# Patient Record
Sex: Female | Born: 1945 | Race: White | Hispanic: No | Marital: Married | Smoking: Never smoker
Health system: Southern US, Community
[De-identification: ages and names within clinical notes are randomized; demographics above are authoritative.]

## PROBLEM LIST (undated history)

## (undated) DIAGNOSIS — K5790 Diverticulosis of intestine, part unspecified, without perforation or abscess without bleeding: Secondary | ICD-10-CM

## (undated) DIAGNOSIS — T8859XA Other complications of anesthesia, initial encounter: Secondary | ICD-10-CM

## (undated) DIAGNOSIS — T4145XA Adverse effect of unspecified anesthetic, initial encounter: Secondary | ICD-10-CM

## (undated) DIAGNOSIS — M1990 Unspecified osteoarthritis, unspecified site: Secondary | ICD-10-CM

## (undated) DIAGNOSIS — I350 Nonrheumatic aortic (valve) stenosis: Secondary | ICD-10-CM

## (undated) DIAGNOSIS — H409 Unspecified glaucoma: Secondary | ICD-10-CM

## (undated) DIAGNOSIS — N183 Chronic kidney disease, stage 3 unspecified: Secondary | ICD-10-CM

## (undated) HISTORY — DX: Nonrheumatic aortic (valve) stenosis: I35.0

---

## 1974-08-09 HISTORY — PX: DIAGNOSTIC LAPAROSCOPY: SUR761

## 1981-08-09 HISTORY — PX: ABDOMINAL HYSTERECTOMY: SHX81

## 1984-08-09 HISTORY — PX: TONSILLECTOMY: SUR1361

## 1998-10-27 ENCOUNTER — Encounter: Payer: Self-pay | Admitting: Family Medicine

## 1998-10-27 ENCOUNTER — Ambulatory Visit (HOSPITAL_COMMUNITY): Admission: RE | Admit: 1998-10-27 | Discharge: 1998-10-27 | Payer: Self-pay | Admitting: Family Medicine

## 1998-11-18 ENCOUNTER — Other Ambulatory Visit: Admission: RE | Admit: 1998-11-18 | Discharge: 1998-11-18 | Payer: Self-pay | Admitting: Obstetrics & Gynecology

## 2001-01-30 ENCOUNTER — Other Ambulatory Visit: Admission: RE | Admit: 2001-01-30 | Discharge: 2001-01-30 | Payer: Self-pay | Admitting: Family Medicine

## 2001-08-03 ENCOUNTER — Encounter: Payer: Self-pay | Admitting: Family Medicine

## 2001-08-03 ENCOUNTER — Ambulatory Visit (HOSPITAL_COMMUNITY): Admission: RE | Admit: 2001-08-03 | Discharge: 2001-08-03 | Payer: Self-pay | Admitting: Family Medicine

## 2002-08-16 ENCOUNTER — Encounter: Payer: Self-pay | Admitting: Orthopedic Surgery

## 2002-08-16 ENCOUNTER — Encounter: Admission: RE | Admit: 2002-08-16 | Discharge: 2002-08-16 | Payer: Self-pay | Admitting: Orthopedic Surgery

## 2003-01-02 ENCOUNTER — Encounter: Payer: Self-pay | Admitting: Neurosurgery

## 2003-01-04 ENCOUNTER — Inpatient Hospital Stay (HOSPITAL_COMMUNITY): Admission: RE | Admit: 2003-01-04 | Discharge: 2003-01-06 | Payer: Self-pay | Admitting: Neurosurgery

## 2003-01-04 ENCOUNTER — Encounter: Payer: Self-pay | Admitting: Neurosurgery

## 2003-02-02 ENCOUNTER — Emergency Department (HOSPITAL_COMMUNITY): Admission: EM | Admit: 2003-02-02 | Discharge: 2003-02-02 | Payer: Self-pay

## 2003-02-02 ENCOUNTER — Encounter: Payer: Self-pay | Admitting: Neurological Surgery

## 2004-04-02 ENCOUNTER — Other Ambulatory Visit: Admission: RE | Admit: 2004-04-02 | Discharge: 2004-04-02 | Payer: Self-pay | Admitting: Family Medicine

## 2004-06-26 ENCOUNTER — Encounter (INDEPENDENT_AMBULATORY_CARE_PROVIDER_SITE_OTHER): Payer: Self-pay | Admitting: *Deleted

## 2004-06-26 ENCOUNTER — Ambulatory Visit (HOSPITAL_BASED_OUTPATIENT_CLINIC_OR_DEPARTMENT_OTHER): Admission: RE | Admit: 2004-06-26 | Discharge: 2004-06-26 | Payer: Self-pay | Admitting: General Surgery

## 2004-06-26 ENCOUNTER — Ambulatory Visit (HOSPITAL_COMMUNITY): Admission: RE | Admit: 2004-06-26 | Discharge: 2004-06-26 | Payer: Self-pay | Admitting: General Surgery

## 2004-11-07 HISTORY — PX: BACK SURGERY: SHX140

## 2005-01-13 ENCOUNTER — Encounter: Admission: RE | Admit: 2005-01-13 | Discharge: 2005-01-13 | Payer: Self-pay | Admitting: Neurosurgery

## 2005-03-17 ENCOUNTER — Inpatient Hospital Stay (HOSPITAL_COMMUNITY): Admission: RE | Admit: 2005-03-17 | Discharge: 2005-03-22 | Payer: Self-pay | Admitting: Neurosurgery

## 2005-07-13 ENCOUNTER — Ambulatory Visit: Payer: Self-pay | Admitting: Internal Medicine

## 2005-07-15 ENCOUNTER — Encounter (INDEPENDENT_AMBULATORY_CARE_PROVIDER_SITE_OTHER): Payer: Self-pay | Admitting: *Deleted

## 2005-07-15 ENCOUNTER — Ambulatory Visit: Payer: Self-pay | Admitting: Internal Medicine

## 2005-07-23 ENCOUNTER — Encounter (HOSPITAL_COMMUNITY): Admission: RE | Admit: 2005-07-23 | Discharge: 2005-08-06 | Payer: Self-pay | Admitting: Internal Medicine

## 2005-09-02 ENCOUNTER — Ambulatory Visit: Payer: Self-pay | Admitting: Internal Medicine

## 2006-08-09 HISTORY — PX: KNEE ARTHROPLASTY: SHX992

## 2006-10-16 ENCOUNTER — Encounter: Admission: RE | Admit: 2006-10-16 | Discharge: 2006-10-16 | Payer: Self-pay | Admitting: Orthopedic Surgery

## 2006-12-12 ENCOUNTER — Inpatient Hospital Stay (HOSPITAL_COMMUNITY): Admission: RE | Admit: 2006-12-12 | Discharge: 2006-12-15 | Payer: Self-pay | Admitting: Orthopedic Surgery

## 2008-07-12 ENCOUNTER — Other Ambulatory Visit: Admission: RE | Admit: 2008-07-12 | Discharge: 2008-07-12 | Payer: Self-pay | Admitting: Family Medicine

## 2009-01-29 ENCOUNTER — Encounter (INDEPENDENT_AMBULATORY_CARE_PROVIDER_SITE_OTHER): Payer: Self-pay | Admitting: *Deleted

## 2009-02-03 ENCOUNTER — Encounter: Admission: RE | Admit: 2009-02-03 | Discharge: 2009-02-03 | Payer: Self-pay | Admitting: Neurosurgery

## 2009-05-06 ENCOUNTER — Ambulatory Visit (HOSPITAL_COMMUNITY): Admission: RE | Admit: 2009-05-06 | Discharge: 2009-05-06 | Payer: Self-pay | Admitting: Neurosurgery

## 2009-05-26 ENCOUNTER — Encounter: Admission: RE | Admit: 2009-05-26 | Discharge: 2009-05-26 | Payer: Self-pay | Admitting: Orthopedic Surgery

## 2009-08-25 ENCOUNTER — Telehealth: Payer: Self-pay | Admitting: Internal Medicine

## 2009-09-08 ENCOUNTER — Inpatient Hospital Stay (HOSPITAL_COMMUNITY): Admission: RE | Admit: 2009-09-08 | Discharge: 2009-09-11 | Payer: Self-pay | Admitting: Orthopedic Surgery

## 2010-03-09 HISTORY — PX: JOINT REPLACEMENT: SHX530

## 2010-09-08 NOTE — Progress Notes (Signed)
Summary: Changed Practices --- GI  Phone Note Outgoing Call Call back at Home Phone [phone]   Call placed by: Harlow Mares CMA Duncan Dull),  August 25, 2009 10:37 AM Call placed to: Patient Summary of Call: patients transfered care for GI Initial call taken by: Harlow Mares CMA Duncan Dull),  August 25, 2009 10:38 AM

## 2010-10-25 LAB — COMPREHENSIVE METABOLIC PANEL
ALT: 17 U/L (ref 0–35)
Albumin: 4.2 g/dL (ref 3.5–5.2)
Alkaline Phosphatase: 61 U/L (ref 39–117)
BUN: 24 mg/dL — ABNORMAL HIGH (ref 6–23)
Potassium: 4.4 mEq/L (ref 3.5–5.1)
Sodium: 136 mEq/L (ref 135–145)
Total Protein: 7.1 g/dL (ref 6.0–8.3)

## 2010-10-25 LAB — URINE CULTURE: Colony Count: 15000

## 2010-10-25 LAB — APTT: aPTT: 27 seconds (ref 24–37)

## 2010-10-25 LAB — DIFFERENTIAL
Basophils Relative: 1 % (ref 0–1)
Monocytes Absolute: 0.4 10*3/uL (ref 0.1–1.0)
Monocytes Relative: 8 % (ref 3–12)
Neutro Abs: 2.4 10*3/uL (ref 1.7–7.7)

## 2010-10-25 LAB — CROSSMATCH

## 2010-10-25 LAB — CBC
Platelets: 239 10*3/uL (ref 150–400)
RDW: 12.1 % (ref 11.5–15.5)

## 2010-10-25 LAB — URINALYSIS, ROUTINE W REFLEX MICROSCOPIC
Bilirubin Urine: NEGATIVE
Nitrite: NEGATIVE
Specific Gravity, Urine: 1.007 (ref 1.005–1.030)
pH: 7 (ref 5.0–8.0)

## 2010-10-28 LAB — BASIC METABOLIC PANEL
BUN: 10 mg/dL (ref 6–23)
CO2: 28 mEq/L (ref 19–32)
Calcium: 8.4 mg/dL (ref 8.4–10.5)
Chloride: 104 mEq/L (ref 96–112)
Chloride: 106 mEq/L (ref 96–112)
GFR calc Af Amer: >60 mL/min (ref >60)
GFR calc Af Amer: >60 mL/min (ref >60)
GFR calc non Af Amer: >60 mL/min (ref >60)
GFR calc non Af Amer: >60 mL/min (ref >60)
Glucose, Bld: 113 mg/dL — ABNORMAL HIGH (ref 70–99)
Glucose, Bld: 123 mg/dL — ABNORMAL HIGH (ref 70–99)
Potassium: 3.7 mEq/L (ref 3.5–5.1)
Potassium: 3.7 mEq/L (ref 3.5–5.1)
Potassium: 3.9 mEq/L (ref 3.5–5.1)
Sodium: 137 mEq/L (ref 135–145)
Sodium: 138 mEq/L (ref 135–145)
Sodium: 138 mEq/L (ref 135–145)

## 2010-10-28 LAB — CBC
HCT: 26.6 % — ABNORMAL LOW (ref 36.0–46.0)
HCT: 27.8 % — ABNORMAL LOW (ref 36.0–46.0)
Hemoglobin: 9 g/dL — ABNORMAL LOW (ref 12.0–15.0)
Hemoglobin: 9.3 g/dL — ABNORMAL LOW (ref 12.0–15.0)
Hemoglobin: 9.8 g/dL — ABNORMAL LOW (ref 12.0–15.0)
MCHC: 33.7 g/dL (ref 30.0–36.0)
MCV: 93.6 fL (ref 78.0–100.0)
Platelets: 175 10*3/uL (ref 150–400)
Platelets: 200 10*3/uL (ref 150–400)
RDW: 12.6 % (ref 11.5–15.5)
RDW: 12.9 % (ref 11.5–15.5)
WBC: 4.4 10*3/uL (ref 4.0–10.5)
WBC: 5.8 10*3/uL (ref 4.0–10.5)

## 2010-12-25 NOTE — Op Note (Signed)
[name], [name] NO.:  [id number]   MEDICAL RECORD NO.:  [id number]          PATIENT TYPE:  INP   LOCATION:  2899                         FACILITY:  [facility]   PHYSICIAN:  Payton Doughty, M.D.      DATE OF BIRTH:  1945/11/24   DATE OF PROCEDURE:  03/17/2005  DATE OF DISCHARGE:                                 OPERATIVE REPORT   PREOPERATIVE DIAGNOSIS:  Spondylosis, L2-3, L3-4, L4-5, L5-S1.   POSTOPERATIVE DIAGNOSIS:  Spondylosis, L2-3, L3-4, L4-5, L5-S1, plus  bilateral pars fractures at L3.   PROCEDURE:  L2-3, L3-4, L4-5 and L5-S1 laminectomy, diskectomy and posterior  lumbar interbody fusion with Ray threaded fusion cages, pedicle screw  fixation from L2 to S1 and posterolateral arthrodesis, L2 to S1.   SURGEON:  Payton Doughty, M.D.   NURSE ASSISTANT:  Gleed.   DOCTOR ASSISTANT:  Coletta Memos, M.D.   SERVICE:  Neurosurgery.   ANESTHESIA:  General endotracheal.   PREPARATION:  Prepped with sterile Betadine prep and scrubbed with alcohol  wipe.   COMPLICATIONS:  None.   BODY OF TEXT:  [age]-year-old lady with severe neurogenic claudication  and lumbar spondylosis.   DESCRIPTION OF PROCEDURE:  She was taken to the operating room and smooth  anesthetized and intubated, and placed prone on the operating table.  Following shaving, prepped and draped in the usual sterile fashion.  Skin  was infiltrated with 1% lidocaine and 1:400,000 epinephrine.  The old skin  incision was reopened and extended up to the mid L1 and down to mid S1.  The  lamina and transverse processes of L2, L3, L4, L5 and the sacral ala were  exposed bilaterally in the subperiosteal plane.  Intraoperative x-ray  confirmed correctness of the level.  Having confirmed correctness of the  level, the pars interarticularis, lamina and inferior remaining lamina and  inferior facet of L2, L3, L4 and L5 were removed bilaterally.  The superior  facets of L3, L4, L5 and S1 were removed  bilaterally using the high-speed  drill and the bone set aside for grafting.  At 2-3, there was a disk on the  left side with severe spondylitic compression in the nerve root,  particularly on the left side.  At 3-4, at the site of a prior  decompression, there were bilateral pars fractures and a spondylolisthesis.  This was reduced following removal of the fragment of the pars; 4-5 had also  been decompressed, but the pars were intact.  There was significant  spondylitic change in the facet joints and 5-1 was extremely degenerated  with bilateral neural foraminal narrowing, worse on the right than on the  left.  Following complete decompression, her Ray threaded fusion cages, 12 x  21 mm, were placed at L2-3, a single one on the left at L3-4, a 14-mm at L4-  5 and 12-mm at L5-S1.  They were packed with bone graft harvested from the  facet joints.  Pedicle screws were placed in L2, L3, L4, L5 and S1.  Intraoperative x-rays showed good placement of pedicle screws.  They were  connected by a rod and the locking caps locked.  The transverse processes  were decorticated and OP-1 bone-morphogenic protein was placed over as a  posterolateral arthrodesis.  Intraoperative x-rays showed good placement of  Ray cages, pedicle screws, rods and caps.  The __________ tissue and fascia  were closed with 0 Vicryl in interrupted fashion, the subcutaneous tissue  was reapproximated with 3-0 Vicryl in interrupted fashion and the skin was  closed with 3-0 nylon in running-locked fashion.  Betadine and Telfa  dressing were applied and the patient returned to the recovery room in good  condition.       MWR/MEDQ  D:  03/17/2005  T:  03/18/2005  Job:  [id number]

## 2010-12-25 NOTE — Op Note (Signed)
NAMESAIRAH, [name]                          ACCOUNT NO.:  [id number]   MEDICAL RECORD NO.:  [id number]                   PATIENT TYPE:  INP   LOCATION:  3002                                 FACILITY:  [facility]   PHYSICIAN:  Payton Doughty, M.D.                   DATE OF BIRTH:  06/20/1946   DATE OF PROCEDURE:  01/04/2003  DATE OF DISCHARGE:                                 OPERATIVE REPORT   PREOPERATIVE DIAGNOSIS:  Spondylosis/spinal stenosis, L3-4 and L4-5.   POSTOPERATIVE DIAGNOSIS:  Spondylosis/spinal stenosis, L3-4 and L4-5.   OPERATIVE PROCEDURES:  L3-4, L4-5 laminotomy/foraminotomy done bilaterally.   SURGEON:  Dr. Trey Sailors.   ANESTHESIA:  General endotracheal.   COMPLICATIONS:  None.   NURSE ASSISTANT:  Covington.   DOCTOR ASSISTANT:  Dr. Venetia Maxon.   DESCRIPTION OF PROCEDURE:  This is a 65 year old lady with neurogenic  claudication.  __________, intubated, placed prone on the operating table,  whereupon she was prepped and draped in the usual sterile fashion.  She was  infiltrated with 1% lidocaine and 1:5000 epinephrine.  The skin was incised  from the top of L5 to the bottom of L2.  Laminae of L3 and L4 were exposed  bilaterally in the subperiosteal plane, and the intraoperative x-ray  confirmed correctness level.  Having confirmed correctness level,  laminotomy/foraminotomy was done bilaterally at both L4-5 and L5-S1; this  was done with the drill, Kerrison, and Leksell.  On the left side, there was  tight stenosis with abundant redundant ligamentum flavum at L3-4 and L4-5,  less so at 4-5.  Right side was definitely tighter at 3-4.  Complete removal  of the laminae and the attendant ligamentum flavum resulted in immediate  decompression of nerve roots.  There was undercutting of the ligamentum  flavum underneath 5 and underneath 2, and this markedly diminished the  compressive pathology.  Neural foramina were carefully explored.  The  anterior epidural space was  explored; there was a small disk at 3-4, but it  did not appear to be causing a lot of compression; no tightness in the  nerves.  Wound was irrigated, and the laminectomy defects filled with Depo-  Medrol subfat.  The fascia was reapproximated with 0 Vicryl in interrupted  fashion.  Subcutaneous tissues were reapproximated with 0 Vicryl in  interrupted fashion.  Subcuticular tissues were reapproximated with 3-0  Vicryl in interrupted fashion.  Skin was closed with 3-0 nylon in a running  locked fashion.  Betadine topical dressing was applied and an occlusive  OpSite, and the patient returned to recovery room in good condition.                                               Payton Doughty, M.D.  MWR/MEDQ  D:  01/04/2003  T:  01/04/2003  Job:  [id number]

## 2010-12-25 NOTE — Op Note (Signed)
NAMEDEIONNA, MARCANTONIO                ACCOUNT NO.:  [id number]   MEDICAL RECORD NO.:  [id number]          PATIENT TYPE:  AMB   LOCATION:  DSC                          FACILITY:  [facility]   PHYSICIAN:  Adolph Pollack, M.D.DATE OF BIRTH:  05/27/1946   DATE OF PROCEDURE:  DATE OF DISCHARGE:                                 OPERATIVE REPORT   PREOPERATIVE DIAGNOSIS:  A 3 cm soft tissue mass in the back.   POSTOPERATIVE DIAGNOSIS:  A 3 cm soft tissue mass in the back.   PROCEDURE:  Excision of 3 cm soft tissue mass from the back (appeared to be  epidermoid cyst grossly).   SURGEON:  Adolph Pollack, M.D.   ANESTHESIA:  1% plain lidocaine.   INDICATION:  Ms. Lovick is a 65 year old female who has had a small lump in  her back that has been progressively growing.  She states she had a cyst on  her back and had to have this incised and drained in the past.  She now  presents for excision.   TECHNIQUE:  She is placed in the sitting position.  The mass in the upper  back just to the right of midline was sterilely prepped and draped.  Local  anesthetic was infiltrated in an elliptical fashion around the mass  superficially and deep. An elliptical incision was made through the skin.  Small flaps were raised in all directions sharply and, once I got through  subcutaneous tissue, I was able to excise the mass.  It appeared to be a  cystic-type mass.  This was sent to pathology.   Hemostasis was obtained using interrupted 4-0 Vicryl sutures and  electrocautery.  Once hemostasis was adequate, I then closed the skin in a  single layer with interrupted 3-0 nylon sutures.  Neosporin was applied  followed by a dry dressing.   She tolerated the procedure well without any apparent complications and was  sent home from a minor procedure room in satisfactory condition.  She was  given discharge instructions and told to take the Darvocet that she has at  home as needed for pain.      Tish Men  D:  06/26/2004  T:  06/27/2004  Job:  [id number]   cc:   Hope M. Danella Deis, M.D.  [address]  [place]  [place] [address]  Fax: [phone]   Stacie Acres. White, M.D.  [address]  [place]  [place] [address]  Fax: [phone]

## 2010-12-25 NOTE — Discharge Summary (Signed)
[name], [name]                ACCOUNT NO.:  [id number]   MEDICAL RECORD NO.:  [id number]          PATIENT TYPE:  INP   LOCATION:  3031                         FACILITY:  [facility]   PHYSICIAN:  Payton Doughty, M.D.      DATE OF BIRTH:  July 14, 1946   DATE OF ADMISSION:  03/17/2005  DATE OF DISCHARGE:  03/22/2005                                 DISCHARGE SUMMARY   ADMISSION DIAGNOSIS:  Spondylosis L2-3, L3-4, L4-5, and L5-S1.   DISCHARGE DIAGNOSIS:  Spondylosis L2-3, L3-4, L4-5, and L5-S1.   PROCEDURE:  L2-3, L3-4, L4-5 and L5-S1 laminectomy, discectomy, posterior  lumbar interbody fusion with Ray type effusion cages.  Segmental pedicle  screw fixation from L2 to S1 and posterolateral arthrodesis L2 to S1.   SURGEON:  Payton Doughty, M.D.   COMPLICATIONS:  None.   DISCHARGE STATUS:  Alive and well.   A 65 year old right-handed white lady whose history and physical is  recounted in the chart.  She basically underwent a decompression several  years ago and has had development of spondylosis with spondylolisthesis at  several levels and suffers from neurogenic claudication.   General exam is intact.  Neurologic exam demonstrated neurogenic  claudication.  She was admitted after ascertainment of normal laboratory  values and underwent effusion noted above.   Postoperatively, she did well.  The first postoperative day, she was in the  ACU.  She was on PCA.  She was then transferred to the floor and did well in  physical therapy, being up and about.  She is now able to get up out of bed,  put on her brace, eating and voiding normally.  She was on Darvocet for  pain.  She is being discharged home in the care of her family.   FOLLOW UP:  Her follow-up will be in the [facility]  office in a week for sutures.       MWR/MEDQ  D:  03/22/2005  T:  03/22/2005  Job:  [id number]

## 2010-12-25 NOTE — H&P (Signed)
NAMEVINCENZA, DAIL                          ACCOUNT NO.:  [id number]   MEDICAL RECORD NO.:  [id number]                   PATIENT TYPE:  INP   LOCATION:  3172                                 FACILITY:  [facility]   PHYSICIAN:  Payton Doughty, M.D.                   DATE OF BIRTH:  [birth year]   DATE OF ADMISSION:  01/04/2003  DATE OF DISCHARGE:                                HISTORY & PHYSICAL   ADMISSION DIAGNOSIS:  Spondylosis L3,4; L4,5.   HISTORY OF PRESENT ILLNESS:  A left-handed white lady who was having  difficulty with her back since last August.  Has worsening pain in her back  and proximal lower extremities.  Worse with any activity, such as standing  and walking, difficulty with sleep because of back and leg pain.  She came  to my office with an MRI that showed spinal stenosis at L3,4 and L4,5 and is  admitted now for decompressive laminotomy, foraminotomy.   PAST MEDICAL HISTORY:  Benign.   MEDICATIONS:  She uses Allegra and Ogen for osteoarthritis.  Calcium for  bones.  Darvocet on a p.r.n. basis for back and leg pain.   ALLERGIES:  She is sensitive to CODEINE AND DAYPRO.   PAST SURGICAL HISTORY:  Tonsillectomy in 1986, hysterectomy in 1983,  laparoscopy in 1976.   SOCIAL HISTORY:  Does not smoke, very sparse social drinker.  Is a  Designer, jewellery for [employer].   REVIEW OF SYSTEMS:  Remarkable for glasses, leg pain, leg weakness, back  pain, leg pain with walking.   PHYSICAL EXAMINATION:  HEENT:  Within normal limits.  NECK:  Reasonable range of motion in her neck.  CHEST:  Clear.  CARDIAC:  Regular rate and rhythm.  ABDOMEN:  Nontender, no hepatosplenomegaly.  EXTREMITIES:  Without clubbing, cyanosis.  Peripheral pulses are good.  GENITOURINARY:  Deferred.  NEUROLOGIC:  She is awake, alert and oriented.  Cranial nerves are intact.  Motor exam showed a 5/5 strength throughout the upper and lower extremities.  Reflexes absent in lower extremities.   Sensory deficit described in L5 and  S1 with some L4 distribution.  Attempts progressive exercise.  Peripheral  pulses are good.  Neurologically, she comes accompanied with an MRI that  shows severe spinal stenosis at 3,4 and mild spinal stenosis at 4,5.  This  is based mostly on ligamentum from hypertrophy.   CLINICAL IMPRESSION:  Lumbar spondylosis and neurogenic plication.    PLAN:  Plan for decompressive laminotomy, foraminotomy at 3,4 and 4,5.  There is no evidence of instability, so I think a fusion would be a bit  excessive.  Risks and benefits of this approach have been discussed with her  and she wishes to proceed.  Payton Doughty, M.D.    MWR/MEDQ  D:  01/04/2003  T:  01/04/2003  Job:  [id number]

## 2010-12-25 NOTE — H&P (Signed)
NAMEMAGON, CROSON NO.:  [id number]   MEDICAL RECORD NO.:  [id number]          PATIENT TYPE:  INP   LOCATION:  2899                         FACILITY:  [facility]   PHYSICIAN:  Payton Doughty, M.D.      DATE OF BIRTH:  September 30, 1945   DATE OF ADMISSION:  03/17/2005  DATE OF DISCHARGE:                                HISTORY & PHYSICAL   ADMITTING DIAGNOSES:  Spondylosis L2-3, L3-4, L4-5, and L5-S1.   This is a now 65 year old left-handed white lady who in May of 2004  underwent a decompressive laminotomy, foraminotomy at L3-4 and L4-5 for  spondylosis.  She had done well until a few months ago.  She began  experiencing pain in her back as well as her proximal legs.  She underwent  plain films and MRI that demonstrate degenerative slip at L3-4 and  restenosis at 4-5 based on spondylosis and extensive spondylosis at L2-3.  She is now admitted for decompression and fusion from L2-S1.  Her medical  history is fairly benign.  She uses Allegra and Ogen for osteoarthritis,  calcium for her bones, Darvocet on a p.r.n. basis for back and leg pain.  She is sensitive to CODEINE and DAYPRO.   PAST SURGICAL HISTORY:  Tonsillectomy in 1986, hysterectomy in 1983,  laparoscopy in 1976.   SOCIAL HISTORY:  She does not smoke.  Is a very sparse social drinker and is  a Designer, jewellery for [employer].   REVIEW OF SYSTEMS:  Remarkable for wearing glasses, leg pain, leg weakness,  back pain, leg pain while she is walking.   PHYSICAL EXAMINATION:  HEENT:  Within normal limits.  She has reasonable  range of motion in her neck.  CHEST:  Clear.  CARDIAC:  Regular rate and rhythm.  NEUROLOGIC:  She is awake, alert, and oriented.  Her cranial nerves are  intact.  Motor examination demonstrates 5/5 strength throughout the upper  extremities.  Lower extremities:  She has weakness of the dorsiflexors and  knee extensors bilaterally, worse on the left than on the right.   Sensation  is diminished in L5 and S1 distribution on the right and L2-L3 distribution  on the left.  Reflexes are 1 at the right, flicker at the left, absent at  the ankles bilaterally.  Straight leg raise is bilaterally passive.   MRI results have been reviewed above.   CLINICAL IMPRESSION:  Lumbar spondylosis with neurogenic claudication.   PLAN:  Decompression and fusion at L2-3, 3-4, 4-5, and 5-1.  The risks and  benefits have been discussed and she wished to proceed.     MWR/MEDQ  D:  03/17/2005  T:  03/17/2005  Job:  [id number]

## 2010-12-25 NOTE — Consult Note (Signed)
[name], [name]                          ACCOUNT NO.:  [id number]   MEDICAL RECORD NO.:  [id number]                   PATIENT TYPE:  EMS   LOCATION:  MAJO                                 FACILITY:  [facility]   PHYSICIAN:  Stefani Dama, M.D.               DATE OF BIRTH:  Jun 17, 1946   DATE OF CONSULTATION:  02/02/2003  DATE OF DISCHARGE:                                   CONSULTATION   REASON FOR CONSULTATION:  Lumbar spondylosis and stenosis, L3-4 and L4-5,  status post decompression 01/04/2003, and severe lumbar radicular pain.   HISTORY OF PRESENT ILLNESS:  The patient is a 65 year old white female who  has had lumbar radicular pain and back and left lower extremity pain of  increasing severity this past week.  It seems that on May 28 she underwent  lumbar decompression with bilateral laminotomies and foraminotomies at L3-4  and L4-5 by Dr. Channing Mutters.  She contacted our office earlier this week in Dr.  Temple Pacini absence, indicating that she was having increasing pain in her back,  running down to her left leg.  I started her on oral nonsteroidal anti-  inflammatory in the form of Naprosyn.  She continued to have some symptoms,  and today the pain became severely excruciating and unrelenting.  She could  not move.  She had contacted me, and, after discussion, I suggested she come  to the emergency room.  She denies fever or chills.   PAST MEDICAL HISTORY:  As noted in previous hospital chart.   PHYSICAL EXAMINATION:  GENERAL:  She is an alert, oriented, and cooperative  individual lying comfortably on her left side.  She has great difficulty  lying flat on her back, and she gets excruciating pain in her back and left  lower extremity.  BACK:  Examination of the incision reveals a well-healed scar in the midline  without any significant erythema, puffiness, or other to suggest infection.  NEUROLOGIC:  Motor strength in lower extremities reveals good strength in  iliopsoas, quads,  tibialis anterior, and gastrocnemius.  Deep tendon  reflexes are 2+ in patellae, trace in the Achilles.  Babinski's are  downgoing.  Peripheral pulses are intact.   LABORATORY DATA:  Review of laboratory studies including CBC and sed rate  reveals the white count to be within normal limits of normal.  The red cell  count is normal also.  Sed rate is pending.   Review of CT scan that was performed demonstrates normal postoperative  changes for what would be expected at the L3-4 and L4-5 levels.  No fluid  collections are noted.   IMPRESSION:  At this point, I believe the patient has back pain related to  an inflammatory process.  I suggested that we start her on some IV steroids  and will give a dose of IV Toradol here in the hospital.  Hereafter, will  give her a  course of oral steroids to complete period of 12 days.  The  patient will be seen at regular followup time by Dr. Trey Sailors.  I believe  that the strong anti-inflammatory should hopefully bring this pain under  good control.                                               Stefani Dama, M.D.    [name]  D:  02/02/2003  T:  02/03/2003  Job:  [id number]

## 2010-12-25 NOTE — Op Note (Signed)
[name], [name]                ACCOUNT NO.:  [id number]   MEDICAL RECORD NO.:  [id number]          PATIENT TYPE:  INP   LOCATION:  2550                         FACILITY:  [facility]   PHYSICIAN:  Mila Homer. Sherlean Foot, M.D. DATE OF BIRTH:  February 06, 1946   DATE OF PROCEDURE:  12/12/2006  DATE OF DISCHARGE:                               OPERATIVE REPORT   SURGEON:  Mila Homer. Sherlean Foot, M.D.   ASSISTANT:  Legrand Pitts. Duffy, P.A.   ANESTHESIA:  General.   PREOPERATIVE DIAGNOSIS:  Right knee osteoarthritis.   POSTOPERATIVE DIAGNOSIS:  Right knee osteoarthritis.   PROCEDURE:  Right total knee arthroplasty.   INDICATIONS FOR PROCEDURE:  The patient is a 65 year old white female  with failure of conservative measures for osteoarthritis of the knee.  Informed consent was obtained.   DESCRIPTION OF PROCEDURE:  The patient was laid supine general  anesthesia.  The right leg was prepped and draped in usual sterile  fashion.  Midline incision was made with a #10 blade approximately 6  inches in length.  New blade was used to make a medial parapatellar  arthrotomy and perform a synovectomy.  I then elevated the deep MCL off  the medial crest of the tibia but not down to the pes tendons since this  was a valgus knee.  Then everted the patella.  It was measured 23 mm.  I  reamed down 9 mm and then drilled three lug holes with a 32 mm template.  With 32 trial in place, also measured 22 mm thick.  Then went into  flexion.  Used the extramedullary system on tibia to make a  perpendicular cut to the anatomic axis of tibia.  Then used  intramedullary guide on femur set on 4 degree valgus cut and pinned the  distal femoral cutting block into place, made distal femoral cut with  sagittal saw.  I marked off the epicondylar axis. The posterior condylar  angle measured 3 degrees.  Sized to a size E pinned the 3 degrees  external rotation holes.  I made the anterior-posterior chamfer cuts  with a sagittal saw.  I  then placed a lamina spreader in the knee, had  more tightness on the lateral side, so there performed pie crusting  lateral release with the #15 blade with the knee under tension with a  lamina spreader in place.  Then a good flexion/extension gap balance.  At this point, finished the femur with a size E finishing block drill  and saw, finished the tibia with a size 4 tibial tray drill and keel.  I  then trialed with a size 4 tibia, size E femur, size 10 insert, size 32  patella.  I had to do more releasing of the posterior capsule to get the  knee extended.  It was still a little tight laterally.  We then removed  trial components, copiously irrigated cemented in the components.  Had  good flexion/extension gap balance, good patellar tracking.  Let the  tourniquet down, obtained hemostasis.  I then closed the arthrotomy with  #1 figure eight Vicryl sutures, deep soft  tissues interrupted 0-0 Vicryl  sutures, subcuticular 3-0 Vicryl stitch and skin staples.  I left the  Hemovac coming out superolateral and deep to the arthrotomy and pain  catheter coming out superficially and superficial to the arthrotomy,  coming out superomedially.   ESTIMATED BLOOD LOSS:  300 mL.   TOURNIQUET TIME:  60 minutes.   COMPLICATIONS:  None.           ______________________________  Mila Homer. Sherlean Foot, M.D.     SDL/MEDQ  D:  12/12/2006  T:  12/12/2006  Job:  [id number]

## 2010-12-25 NOTE — Discharge Summary (Signed)
NAMEARROW, EMMERICH                ACCOUNT NO.:  [id number]   MEDICAL RECORD NO.:  [id number]          PATIENT TYPE:  INP   LOCATION:  5011                         FACILITY:  [facility]   PHYSICIAN:  Mila Homer. Sherlean Foot, M.D. DATE OF BIRTH:  1945/09/21   DATE OF ADMISSION:  12/12/2006  DATE OF DISCHARGE:  12/15/2006                               DISCHARGE SUMMARY   ADMISSION DIAGNOSES:  1. End stage osteoarthritis right knee.  2. History of recent lumbar fusion.  3. Chronic constipation.   DISCHARGE DIAGNOSES:  1. End stage osteoarthritis right knee, status post right total knee      arthroplasty.  2. Acute blood loss anemia secondary to surgery.  3. Acute on chronic constipation.  4. History of recent lumbar fusion.   SURGICAL PROCEDURE:  On Dec 12, 2006, Ms. Doble underwent a right total  knee arthroplasty by Dr. Georgena Spurling, assisted by Arnoldo Morale, PA-C.  She had a NexGen fluted stem tibial component size 4 placed with a  NexGen legacy knee LPS femoral component size E right and an articular  surface size striped yellow EF 10 mm height with a NexGen all poly  patella standard size 32 at 8.5 mm thickness.   COMPLICATIONS:  None.   CONSULTS:  1. Case management and physical therapy consult Dec 13, 2006.  2. Occupational therapy consult Dec 14, 2006.   HISTORY OF PRESENT ILLNESS:  This 65 year old, white female patient  presented to Dr. Sherlean Foot with a history of intermittent moderate aching to  stabbing and sharp pain in her right knee.  She had been having  difficulty doing her own ADLs, and occasionally, it keeps her up at  night.  She has failed conservative treatment, and because of that, she  is presenting for a right knee replacement.   HOSPITAL COURSE:  Ms. Delosreyes tolerated her surgical procedure well  without immediate postoperative complications.  She was transferred to  5000.  Postop day 1, T max was 97.6.  Vitals were stable.  Hemoglobin  10.5, hematocrit 31.6.   Hemovac wasdiscontinued.  She was tolerating  CPM.  She was weaned off her oxygen and started on therapy per protocol.   On postop day #2, T-max 99.2.  Pain well controlled with meds.  Hemoglobin 10.1, hematocrit 29.5.  Her dressing was changed and pain  pump was DC 'd.  She was continued on therapy.  Plans were made for  possible DC home in the evening, but she did need to have her  constipation treated at that time.  She continued with therapy.   On postop day #3, pain was well controlled.  Hemoglobin and hematocrit  were nice and stable.  Vitals were stable.  She had a bowel movement  without much difficulty and did require one other laxative that day.  She was able to be DC 'd home later that day.   DISCHARGE INSTRUCTIONS:  Diet:  She can resume her regular  prehospitalization diet.   MEDICATIONS:  1. Multivitamin 1 tablet p.o. q.a.m.  2. Mucinex 400 mg half to 1 tablet p.o. q.a.m. p.r.n.  3.  __________  0.625 mg 1 tablet p.o. q.a.m.  4. Zyrtec-D half to 1 tablet p.o. q.a.m. p.r.n.  5. Neurontin 300 mg 2 tablets p.o. q.8 h.  6. Celebrex 200 mg 1 tablet p.o. q.a.m.  7. Calcium plus vitamin D 600 mg 1 tablet p.o. b.i.d.  8. MiraLax 17 gm 1 dose in 8 oz of water p.o. q.a.m.  9. Pepcid 20 mg 1-2 tablets p.o. q.a.m.  10.Colace 100 mg 3 tablets p.o. q.h.s.  11.Psyllium 520 mg 4 tablets p.o. q.a.m. and 1 tablet p.o. q.p.m.  12.Operative drops 1-2 drops each eye 4 times a day.  13.Meclizine 25 mg half to 1 tablet p.o. t.i.d. p.r.n. dizziness.  14.Visine eye drops 1-2 drops bilateral eyes p.r.n.  15.Tylenol 1-2 tablets p.o. q.4 h. p.r.n. for pain.   Additional meds at this time include:   1. Lovenox 40 mg subcu q.8 a.m. with the last dose Dec 26, 2006.  She      can resume her aspirin on the twentieth.  2. Percocet 5/325 mg 1-2 tablets p.o. q.4 h. p.r.n. for pain 60 with      no refill.  3. Robaxin 500 mg 1-2 tablets p.o. q.6 h. p.r.n. for spasms 40 with no      refill.  4.  Ambien 10 mg half to 1 tablet p.o. q.h.s. p.r.n. insomnia 20 with      no refill.   ACTIVITY:  She can be out of bed weightbearing as tolerated on the right  leg with use of the walker.  She is to have home CPM 0 to 100 degrees a  day, 6-8 hours a day, and home health PT per [facility].  Please see the blue total knee discharge sheet for further activity  instructions.   WOUND CARE:  She may shower after no drainage from the wound for 2 days.  Please see the blue total knee discharge sheet for further wound care  instructions.   FOLLOWUP:  She needs to followup with Dr. Sherlean Foot in our office on  Tuesday, May 20, and needs to call [phone] for that appointment.   LABORATORY DATA:  Hemoglobin and hematocrit ranged from 14 and 41.7 on  the 29th to 9.7 and 28.9 on the 8th.  White count and platelets remained  within normal limits.  Glucose went to a high of 128 on the 6th.  Otherwise, BMET was within normal limits.  Calcium dropped to a low of  8.26 and then went back up to 8.7.  All other laboratory studies were  within normal limits.     Legrand Pitts Duffy, P.A.    ______________________________  Mila Homer. Sherlean Foot, M.D.   KED/MEDQ  D:  03/14/2007  T:  03/14/2007  Job:  [id number]

## 2012-04-18 ENCOUNTER — Ambulatory Visit
Admission: RE | Admit: 2012-04-18 | Discharge: 2012-04-18 | Disposition: A | Discharge status: Home / Self Care | Payer: Medicare Other | Source: Ambulatory Visit | Attending: Gastroenterology | Admitting: Gastroenterology

## 2012-04-18 ENCOUNTER — Other Ambulatory Visit: Payer: Self-pay | Admitting: Gastroenterology

## 2012-04-18 DIAGNOSIS — Q438 Other specified congenital malformations of intestine: Secondary | ICD-10-CM

## 2013-07-27 ENCOUNTER — Other Ambulatory Visit: Payer: Self-pay | Admitting: Orthopedic Surgery

## 2013-07-30 ENCOUNTER — Encounter (HOSPITAL_COMMUNITY): Payer: Self-pay | Admitting: Pharmacy Technician

## 2013-08-06 ENCOUNTER — Encounter (HOSPITAL_COMMUNITY): Payer: Self-pay

## 2013-08-06 ENCOUNTER — Encounter (HOSPITAL_COMMUNITY)
Admission: RE | Admit: 2013-08-06 | Discharge: 2013-08-06 | Disposition: A | Discharge status: Home / Self Care | Payer: Medicare Other | Source: Ambulatory Visit | Attending: Orthopedic Surgery | Admitting: Orthopedic Surgery

## 2013-08-06 ENCOUNTER — Ambulatory Visit (HOSPITAL_COMMUNITY)
Admission: RE | Admit: 2013-08-06 | Discharge: 2013-08-06 | Disposition: A | Discharge status: Home / Self Care | Payer: Medicare Other | Source: Ambulatory Visit | Attending: Orthopedic Surgery | Admitting: Orthopedic Surgery

## 2013-08-06 ENCOUNTER — Other Ambulatory Visit (HOSPITAL_COMMUNITY): Payer: Self-pay | Admitting: *Deleted

## 2013-08-06 DIAGNOSIS — K5730 Diverticulosis of large intestine without perforation or abscess without bleeding: Secondary | ICD-10-CM | POA: Insufficient documentation

## 2013-08-06 DIAGNOSIS — Z981 Arthrodesis status: Secondary | ICD-10-CM | POA: Insufficient documentation

## 2013-08-06 DIAGNOSIS — Z01810 Encounter for preprocedural cardiovascular examination: Secondary | ICD-10-CM | POA: Insufficient documentation

## 2013-08-06 DIAGNOSIS — Z0183 Encounter for blood typing: Secondary | ICD-10-CM | POA: Insufficient documentation

## 2013-08-06 DIAGNOSIS — M1710 Unilateral primary osteoarthritis, unspecified knee: Secondary | ICD-10-CM | POA: Insufficient documentation

## 2013-08-06 DIAGNOSIS — Z01818 Encounter for other preprocedural examination: Secondary | ICD-10-CM | POA: Insufficient documentation

## 2013-08-06 DIAGNOSIS — M47814 Spondylosis without myelopathy or radiculopathy, thoracic region: Secondary | ICD-10-CM | POA: Insufficient documentation

## 2013-08-06 DIAGNOSIS — Z01812 Encounter for preprocedural laboratory examination: Secondary | ICD-10-CM | POA: Insufficient documentation

## 2013-08-06 HISTORY — DX: Diverticulosis of intestine, part unspecified, without perforation or abscess without bleeding: K57.90

## 2013-08-06 HISTORY — DX: Adverse effect of unspecified anesthetic, initial encounter: T41.45XA

## 2013-08-06 HISTORY — DX: Unspecified glaucoma: H40.9

## 2013-08-06 HISTORY — DX: Other complications of anesthesia, initial encounter: T88.59XA

## 2013-08-06 HISTORY — DX: Unspecified osteoarthritis, unspecified site: M19.90

## 2013-08-06 LAB — CBC WITH DIFFERENTIAL/PLATELET
Basophils Absolute: 0 10*3/uL (ref 0.0–0.1)
Basophils Relative: 0 % (ref 0–1)
Eosinophils Relative: 3 % (ref 0–5)
HCT: 39.5 % (ref 36.0–46.0)
Lymphocytes Relative: 31 % (ref 12–46)
MCH: 31.4 pg (ref 26.0–34.0)
MCHC: 33.7 g/dL (ref 30.0–36.0)
MCV: 93.2 fL (ref 78.0–100.0)
Monocytes Absolute: 0.5 10*3/uL (ref 0.1–1.0)
Monocytes Relative: 11 % (ref 3–12)
Neutro Abs: 2.9 10*3/uL (ref 1.7–7.7)
Platelets: 206 10*3/uL (ref 150–400)
RBC: 4.24 MIL/uL (ref 3.87–5.11)
RDW: 13.9 % (ref 11.5–15.5)
WBC: 5.2 10*3/uL (ref 4.0–10.5)

## 2013-08-06 LAB — URINALYSIS, ROUTINE W REFLEX MICROSCOPIC
Glucose, UA: NEGATIVE mg/dL
Ketones, ur: NEGATIVE mg/dL
Leukocytes, UA: NEGATIVE
Nitrite: NEGATIVE
Specific Gravity, Urine: 1.017 (ref 1.005–1.030)
pH: 7 (ref 5.0–8.0)

## 2013-08-06 LAB — COMPREHENSIVE METABOLIC PANEL
ALT: 17 U/L (ref 0–35)
AST: 23 U/L (ref 0–37)
Albumin: 3.9 g/dL (ref 3.5–5.2)
CO2: 28 mEq/L (ref 19–32)
Calcium: 9.1 mg/dL (ref 8.4–10.5)
Creatinine, Ser: 0.78 mg/dL (ref 0.50–1.10)
GFR calc non Af Amer: 85 mL/min — ABNORMAL LOW (ref >90)
Glucose, Bld: 67 mg/dL — ABNORMAL LOW (ref 70–99)
Sodium: 140 mEq/L (ref 135–145)

## 2013-08-06 LAB — APTT: aPTT: 27 seconds (ref 24–37)

## 2013-08-06 LAB — SURGICAL PCR SCREEN: MRSA, PCR: NEGATIVE

## 2013-08-06 LAB — TYPE AND SCREEN: ABO/RH(D): A NEG

## 2013-08-06 LAB — PROTIME-INR: INR: 1.03 (ref 0.00–1.49)

## 2013-08-06 NOTE — Pre-Procedure Instructions (Addendum)
XOEY WARMOTH  08/06/2013   Your procedure is scheduled on:  08/13/13  Report to [facility] short stay admitting at 530* AM.  Call this number if you have problems the morning of surgery: [phone]   Remember:   Do not eat food or drink liquids after midnight.   Take these medicines the morning of surgery with A SIP OF WATER: eye drops, pepcid         STOP all herbel meds, nsaids (aleve,naproxen,advil,ibuprofen) 5 days prior to surgery including aspirin, vitamins, glucosamine-chondroitin,flaxseed   Do not wear jewelry, make-up or nail polish.  Do not wear lotions, powders, or perfumes. You may wear deodorant.  Do not shave 48 hours prior to surgery. Men may shave face and neck.  Do not bring valuables to the hospital.  [facility] is not responsible                  for any belongings or valuables.               Contacts, dentures or bridgework may not be worn into surgery.  Leave suitcase in the car. After surgery it may be brought to your room.  For patients admitted to the hospital, discharge time is determined by your                treatment team.               Patients discharged the day of surgery will not be allowed to drive  home.  Name and phone number of your driver:   Special Instructions: Shower using CHG 2 nights before surgery and the night before surgery.  If you shower the day of surgery use CHG.  Use special wash - you have one bottle of CHG for all showers.  You should use approximately 1/3 of the bottle for each shower.   Please read over the following fact sheets that you were given: Pain Booklet, Coughing and Deep Breathing, Blood Transfusion Information, Total Joint Packet, MRSA Information and Surgical Site Infection Prevention

## 2013-08-07 LAB — URINE CULTURE
Colony Count: NO GROWTH
Culture: NO GROWTH

## 2013-08-12 MED ORDER — CEFAZOLIN SODIUM-DEXTROSE 2-3 GM-% IV SOLR
2.0000 g | INTRAVENOUS | Status: AC
  Administered 2013-08-13: 2 g via INTRAVENOUS
  Filled 2013-08-12: qty 50

## 2013-08-12 MED ORDER — TRANEXAMIC ACID 100 MG/ML IV SOLN
1000.0000 mg | INTRAVENOUS | Status: AC
  Administered 2013-08-13: 1000 mg via INTRAVENOUS
  Filled 2013-08-12: qty 10

## 2013-08-12 MED ORDER — BUPIVACAINE LIPOSOME 1.3 % IJ SUSP
20.0000 mL | Freq: Once | INTRAMUSCULAR | Status: DC
  Filled 2013-08-12: qty 20

## 2013-08-13 ENCOUNTER — Inpatient Hospital Stay (HOSPITAL_COMMUNITY): Payer: Medicare Other | Admitting: Anesthesiology

## 2013-08-13 ENCOUNTER — Encounter (HOSPITAL_COMMUNITY): Payer: Self-pay | Admitting: *Deleted

## 2013-08-13 ENCOUNTER — Encounter (HOSPITAL_COMMUNITY)
Admission: RE | Disposition: A | Discharge status: Home Health | Payer: Self-pay | Source: Ambulatory Visit | Attending: Orthopedic Surgery

## 2013-08-13 ENCOUNTER — Encounter (HOSPITAL_COMMUNITY): Payer: Medicare Other | Admitting: Anesthesiology

## 2013-08-13 ENCOUNTER — Inpatient Hospital Stay (HOSPITAL_COMMUNITY)
Admission: RE | Admit: 2013-08-13 | Discharge: 2013-08-14 | DRG: 470 | Disposition: A | Discharge status: Home Health | Payer: Medicare Other | Source: Ambulatory Visit | Attending: Orthopedic Surgery | Admitting: Orthopedic Surgery

## 2013-08-13 DIAGNOSIS — Z96652 Presence of left artificial knee joint: Secondary | ICD-10-CM

## 2013-08-13 DIAGNOSIS — Z96659 Presence of unspecified artificial knee joint: Secondary | ICD-10-CM

## 2013-08-13 DIAGNOSIS — M1710 Unilateral primary osteoarthritis, unspecified knee: Principal | ICD-10-CM | POA: Diagnosis present

## 2013-08-13 DIAGNOSIS — K5730 Diverticulosis of large intestine without perforation or abscess without bleeding: Secondary | ICD-10-CM | POA: Diagnosis present

## 2013-08-13 DIAGNOSIS — Z96649 Presence of unspecified artificial hip joint: Secondary | ICD-10-CM | POA: Diagnosis not present

## 2013-08-13 DIAGNOSIS — M25569 Pain in unspecified knee: Secondary | ICD-10-CM | POA: Diagnosis present

## 2013-08-13 DIAGNOSIS — D62 Acute posthemorrhagic anemia: Secondary | ICD-10-CM | POA: Diagnosis not present

## 2013-08-13 DIAGNOSIS — H409 Unspecified glaucoma: Secondary | ICD-10-CM | POA: Diagnosis present

## 2013-08-13 HISTORY — PX: TOTAL KNEE ARTHROPLASTY: SHX125

## 2013-08-13 LAB — CBC
HCT: 36.4 % (ref 36.0–46.0)
Hemoglobin: 12.5 g/dL (ref 12.0–15.0)
MCH: 31.2 pg (ref 26.0–34.0)
MCHC: 34.3 g/dL (ref 30.0–36.0)
MCV: 90.8 fL (ref 78.0–100.0)
PLATELETS: 210 10*3/uL (ref 150–400)
RBC: 4.01 MIL/uL (ref 3.87–5.11)
RDW: 13.8 % (ref 11.5–15.5)
WBC: 7.3 10*3/uL (ref 4.0–10.5)

## 2013-08-13 LAB — CREATININE, SERUM
CREATININE: 0.59 mg/dL (ref 0.50–1.10)
GFR calc non Af Amer: >90 mL/min (ref >90)

## 2013-08-13 SURGERY — ARTHROPLASTY, KNEE, TOTAL
Anesthesia: General | Site: Knee | Laterality: Left

## 2013-08-13 MED ORDER — DOCUSATE SODIUM 100 MG PO CAPS
100.0000 mg | ORAL_CAPSULE | Freq: Two times a day (BID) | ORAL | Status: DC
  Administered 2013-08-13 – 2013-08-14 (×2): 100 mg via ORAL
  Filled 2013-08-13 (×3): qty 1

## 2013-08-13 MED ORDER — HYDROMORPHONE HCL PF 1 MG/ML IJ SOLN
0.2500 mg | INTRAMUSCULAR | Status: DC | PRN
  Administered 2013-08-13 (×4): 0.5 mg via INTRAVENOUS

## 2013-08-13 MED ORDER — SENNOSIDES-DOCUSATE SODIUM 8.6-50 MG PO TABS: 1.0000 | ORAL_TABLET | Freq: Every evening | ORAL | Status: DC | PRN

## 2013-08-13 MED ORDER — SODIUM CHLORIDE 0.9 % IR SOLN
Status: DC | PRN
  Administered 2013-08-13: 1000 mL

## 2013-08-13 MED ORDER — SCOPOLAMINE 1 MG/3DAYS TD PT72: MEDICATED_PATCH | TRANSDERMAL | Status: DC | PRN

## 2013-08-13 MED ORDER — PROPOFOL 10 MG/ML IV BOLUS
INTRAVENOUS | Status: DC | PRN
  Administered 2013-08-13: 140 mg via INTRAVENOUS

## 2013-08-13 MED ORDER — PHENOL 1.4 % MT LIQD: 1.0000 | OROMUCOSAL | Status: DC | PRN

## 2013-08-13 MED ORDER — ONDANSETRON HCL 4 MG/2ML IJ SOLN
INTRAMUSCULAR | Status: DC | PRN
  Administered 2013-08-13: 4 mg via INTRAVENOUS

## 2013-08-13 MED ORDER — 0.9 % SODIUM CHLORIDE (POUR BTL) OPTIME
TOPICAL | Status: DC | PRN
  Administered 2013-08-13: 1000 mL

## 2013-08-13 MED ORDER — HYDROMORPHONE HCL PF 1 MG/ML IJ SOLN
INTRAMUSCULAR | Status: AC
  Administered 2013-08-13: 0.5 mg via INTRAVENOUS
  Filled 2013-08-13: qty 2

## 2013-08-13 MED ORDER — ONDANSETRON HCL 4 MG/2ML IJ SOLN
INTRAMUSCULAR | Status: AC
  Filled 2013-08-13: qty 2

## 2013-08-13 MED ORDER — FLEET ENEMA 7-19 GM/118ML RE ENEM: 1.0000 | ENEMA | Freq: Once | RECTAL | Status: AC | PRN

## 2013-08-13 MED ORDER — LACTATED RINGERS IV SOLN
INTRAVENOUS | Status: DC | PRN
  Administered 2013-08-13 (×2): via INTRAVENOUS

## 2013-08-13 MED ORDER — ALUM & MAG HYDROXIDE-SIMETH 200-200-20 MG/5ML PO SUSP: 30.0000 mL | ORAL | Status: DC | PRN

## 2013-08-13 MED ORDER — DEXAMETHASONE SODIUM PHOSPHATE 4 MG/ML IJ SOLN
INTRAMUSCULAR | Status: DC | PRN
  Administered 2013-08-13: 4 mg via INTRAVENOUS

## 2013-08-13 MED ORDER — ONDANSETRON HCL 4 MG/2ML IJ SOLN: 4.0000 mg | Freq: Once | INTRAMUSCULAR | Status: DC | PRN

## 2013-08-13 MED ORDER — METOCLOPRAMIDE HCL 5 MG/ML IJ SOLN: 5.0000 mg | Freq: Three times a day (TID) | INTRAMUSCULAR | Status: DC | PRN

## 2013-08-13 MED ORDER — BISACODYL 5 MG PO TBEC: 5.0000 mg | DELAYED_RELEASE_TABLET | Freq: Every day | ORAL | Status: DC | PRN

## 2013-08-13 MED ORDER — METOCLOPRAMIDE HCL 10 MG PO TABS
5.0000 mg | ORAL_TABLET | Freq: Three times a day (TID) | ORAL | Status: DC | PRN
  Administered 2013-08-13: 10 mg via ORAL
  Filled 2013-08-13: qty 1

## 2013-08-13 MED ORDER — SODIUM CHLORIDE 0.9 % IV SOLN
INTRAVENOUS | Status: DC
  Administered 2013-08-13: 75 mL/h via INTRAVENOUS
  Administered 2013-08-14: 06:00:00 via INTRAVENOUS

## 2013-08-13 MED ORDER — BUPIVACAINE-EPINEPHRINE (PF) 0.5% -1:200000 IJ SOLN
INTRAMUSCULAR | Status: AC
  Filled 2013-08-13: qty 10

## 2013-08-13 MED ORDER — METHOCARBAMOL 100 MG/ML IJ SOLN: 500.0000 mg | Freq: Four times a day (QID) | INTRAVENOUS | Status: DC | PRN

## 2013-08-13 MED ORDER — ENOXAPARIN SODIUM 30 MG/0.3ML ~~LOC~~ SOLN
30.0000 mg | Freq: Two times a day (BID) | SUBCUTANEOUS | Status: DC
  Administered 2013-08-14: 30 mg via SUBCUTANEOUS
  Filled 2013-08-13 (×3): qty 0.3

## 2013-08-13 MED ORDER — FAMOTIDINE 20 MG PO TABS
20.0000 mg | ORAL_TABLET | Freq: Every day | ORAL | Status: DC | PRN
  Filled 2013-08-13: qty 1

## 2013-08-13 MED ORDER — BUPIVACAINE LIPOSOME 1.3 % IJ SUSP
INTRAMUSCULAR | Status: DC | PRN
  Administered 2013-08-13: 20 mL

## 2013-08-13 MED ORDER — CHLORHEXIDINE GLUCONATE 4 % EX LIQD: 60.0000 mL | Freq: Once | CUTANEOUS | Status: DC

## 2013-08-13 MED ORDER — EPHEDRINE SULFATE 50 MG/ML IJ SOLN
INTRAMUSCULAR | Status: DC | PRN
  Administered 2013-08-13: 10 mg via INTRAVENOUS
  Administered 2013-08-13 (×2): 15 mg via INTRAVENOUS
  Administered 2013-08-13: 10 mg via INTRAVENOUS

## 2013-08-13 MED ORDER — HYDROMORPHONE HCL 2 MG PO TABS
2.0000 mg | ORAL_TABLET | ORAL | Status: DC | PRN
  Administered 2013-08-13 – 2013-08-14 (×7): 2 mg via ORAL
  Filled 2013-08-13 (×7): qty 1

## 2013-08-13 MED ORDER — CEFAZOLIN SODIUM 1-5 GM-% IV SOLN
1.0000 g | Freq: Four times a day (QID) | INTRAVENOUS | Status: AC
  Administered 2013-08-13 (×2): 1 g via INTRAVENOUS
  Filled 2013-08-13 (×3): qty 50

## 2013-08-13 MED ORDER — ONDANSETRON HCL 4 MG PO TABS: 4.0000 mg | ORAL_TABLET | Freq: Four times a day (QID) | ORAL | Status: DC | PRN

## 2013-08-13 MED ORDER — METHOCARBAMOL 500 MG PO TABS
500.0000 mg | ORAL_TABLET | Freq: Four times a day (QID) | ORAL | Status: DC | PRN
  Administered 2013-08-13 – 2013-08-14 (×2): 500 mg via ORAL
  Filled 2013-08-13 (×2): qty 1

## 2013-08-13 MED ORDER — MENTHOL 3 MG MT LOZG: 1.0000 | LOZENGE | OROMUCOSAL | Status: DC | PRN

## 2013-08-13 MED ORDER — ONDANSETRON HCL 4 MG/2ML IJ SOLN: 4.0000 mg | Freq: Four times a day (QID) | INTRAMUSCULAR | Status: DC | PRN

## 2013-08-13 MED ORDER — SCOPOLAMINE 1 MG/3DAYS TD PT72
1.0000 | MEDICATED_PATCH | TRANSDERMAL | Status: AC
  Administered 2013-08-13: 1 via TRANSDERMAL

## 2013-08-13 MED ORDER — FENTANYL CITRATE 0.05 MG/ML IJ SOLN
INTRAMUSCULAR | Status: DC | PRN
  Administered 2013-08-13 (×4): 12.5 ug via INTRAVENOUS
  Administered 2013-08-13: 100 ug via INTRAVENOUS
  Administered 2013-08-13: 12.5 ug via INTRAVENOUS
  Administered 2013-08-13: 50 ug via INTRAVENOUS
  Administered 2013-08-13 (×3): 12.5 ug via INTRAVENOUS

## 2013-08-13 MED ORDER — DIPHENHYDRAMINE HCL 12.5 MG/5ML PO ELIX: 12.5000 mg | ORAL_SOLUTION | ORAL | Status: DC | PRN

## 2013-08-13 MED ORDER — POLYETHYLENE GLYCOL 3350 17 G PO PACK
17.0000 g | PACK | Freq: Every day | ORAL | Status: DC
  Administered 2013-08-13 – 2013-08-14 (×2): 17 g via ORAL
  Filled 2013-08-13 (×2): qty 1

## 2013-08-13 MED ORDER — ZOLPIDEM TARTRATE 5 MG PO TABS: 5.0000 mg | ORAL_TABLET | Freq: Every evening | ORAL | Status: DC | PRN

## 2013-08-13 MED ORDER — SODIUM CHLORIDE 0.9 % IV SOLN: INTRAVENOUS | Status: DC

## 2013-08-13 MED ORDER — ACETAMINOPHEN 650 MG RE SUPP: 650.0000 mg | Freq: Four times a day (QID) | RECTAL | Status: DC | PRN

## 2013-08-13 MED ORDER — BRIMONIDINE TARTRATE 0.2 % OP SOLN
1.0000 [drp] | Freq: Three times a day (TID) | OPHTHALMIC | Status: DC
  Administered 2013-08-13 – 2013-08-14 (×4): 1 [drp] via OPHTHALMIC
  Filled 2013-08-13: qty 5

## 2013-08-13 MED ORDER — BUPIVACAINE-EPINEPHRINE 0.5% -1:200000 IJ SOLN
INTRAMUSCULAR | Status: DC | PRN
  Administered 2013-08-13: 30 mL

## 2013-08-13 MED ORDER — MECLIZINE HCL 25 MG PO TABS
25.0000 mg | ORAL_TABLET | Freq: Three times a day (TID) | ORAL | Status: DC | PRN
  Filled 2013-08-13: qty 1

## 2013-08-13 MED ORDER — HYDROMORPHONE HCL PF 1 MG/ML IJ SOLN
1.0000 mg | INTRAMUSCULAR | Status: DC | PRN
  Administered 2013-08-13 – 2013-08-14 (×3): 1 mg via INTRAVENOUS
  Filled 2013-08-13 (×3): qty 1

## 2013-08-13 MED ORDER — CELECOXIB 200 MG PO CAPS
200.0000 mg | ORAL_CAPSULE | Freq: Two times a day (BID) | ORAL | Status: DC
  Administered 2013-08-13 – 2013-08-14 (×2): 200 mg via ORAL
  Filled 2013-08-13 (×3): qty 1

## 2013-08-13 MED ORDER — ACETAMINOPHEN 325 MG PO TABS: 650.0000 mg | ORAL_TABLET | Freq: Four times a day (QID) | ORAL | Status: DC | PRN

## 2013-08-13 MED ORDER — LIDOCAINE HCL (CARDIAC) 20 MG/ML IV SOLN
INTRAVENOUS | Status: DC | PRN
  Administered 2013-08-13: 100 mg via INTRAVENOUS

## 2013-08-13 MED ORDER — DORZOLAMIDE HCL-TIMOLOL MAL 2-0.5 % OP SOLN
1.0000 [drp] | Freq: Two times a day (BID) | OPHTHALMIC | Status: DC
  Administered 2013-08-13 – 2013-08-14 (×2): 1 [drp] via OPHTHALMIC
  Filled 2013-08-13: qty 10

## 2013-08-13 SURGICAL SUPPLY — 53 items
BANDAGE ESMARK 6X9 LF (GAUZE/BANDAGES/DRESSINGS) ×1 IMPLANT
BLADE SAGITTAL 13X1.27X60 (BLADE) ×2 IMPLANT
BLADE SAGITTAL 13X1.27X60MM (BLADE) ×1
BLADE SAW SGTL 83.5X18.5 (BLADE) ×3 IMPLANT
BNDG ESMARK 6X9 LF (GAUZE/BANDAGES/DRESSINGS) ×3
BOWL SMART MIX CTS (DISPOSABLE) ×3 IMPLANT
CAP POR TM CP VIT E LN CER HD ×3 IMPLANT
CEMENT BONE SIMPLEX SPEEDSET (Cement) ×6 IMPLANT
COVER SURGICAL LIGHT HANDLE (MISCELLANEOUS) ×3 IMPLANT
CUFF TOURNIQUET SINGLE 34IN LL (TOURNIQUET CUFF) ×3 IMPLANT
DRAPE EXTREMITY T 121X128X90 (DRAPE) ×3 IMPLANT
DRAPE INCISE IOBAN 66X45 STRL (DRAPES) ×6 IMPLANT
DRAPE PROXIMA HALF (DRAPES) ×3 IMPLANT
DRAPE U-SHAPE 47X51 STRL (DRAPES) ×3 IMPLANT
DRSG ADAPTIC 3X8 NADH LF (GAUZE/BANDAGES/DRESSINGS) ×3 IMPLANT
DRSG PAD ABDOMINAL 8X10 ST (GAUZE/BANDAGES/DRESSINGS) ×3 IMPLANT
DURAPREP 26ML APPLICATOR (WOUND CARE) ×6 IMPLANT
ELECT REM PT RETURN 9FT ADLT (ELECTROSURGICAL) ×3
ELECTRODE REM PT RTRN 9FT ADLT (ELECTROSURGICAL) ×1 IMPLANT
EVACUATOR 1/8 PVC DRAIN (DRAIN) ×3 IMPLANT
GLOVE BIOGEL M 7.0 STRL (GLOVE) IMPLANT
GLOVE BIOGEL PI IND STRL 7.5 (GLOVE) IMPLANT
GLOVE BIOGEL PI IND STRL 8.5 (GLOVE) ×2 IMPLANT
GLOVE BIOGEL PI INDICATOR 7.5 (GLOVE)
GLOVE BIOGEL PI INDICATOR 8.5 (GLOVE) ×4
GLOVE SURG ORTHO 8.0 STRL STRW (GLOVE) ×6 IMPLANT
GOWN PREVENTION PLUS XLARGE (GOWN DISPOSABLE) ×6 IMPLANT
GOWN STRL NON-REIN LRG LVL3 (GOWN DISPOSABLE) ×6 IMPLANT
HANDPIECE INTERPULSE COAX TIP (DISPOSABLE) ×2
HOOD PEEL AWAY FACE SHEILD DIS (HOOD) ×12 IMPLANT
KIT BASIN OR (CUSTOM PROCEDURE TRAY) ×3 IMPLANT
KIT ROOM TURNOVER OR (KITS) ×3 IMPLANT
MANIFOLD NEPTUNE II (INSTRUMENTS) ×3 IMPLANT
NEEDLE 22X1 1/2 (OR ONLY) (NEEDLE) ×3 IMPLANT
NS IRRIG 1000ML POUR BTL (IV SOLUTION) ×3 IMPLANT
PACK TOTAL JOINT (CUSTOM PROCEDURE TRAY) ×3 IMPLANT
PAD ARMBOARD 7.5X6 YLW CONV (MISCELLANEOUS) ×6 IMPLANT
PADDING CAST COTTON 6X4 STRL (CAST SUPPLIES) ×3 IMPLANT
SET HNDPC FAN SPRY TIP SCT (DISPOSABLE) ×1 IMPLANT
SPONGE GAUZE 4X4 12PLY (GAUZE/BANDAGES/DRESSINGS) ×3 IMPLANT
STAPLER VISISTAT 35W (STAPLE) ×3 IMPLANT
SUCTION FRAZIER TIP 10 FR DISP (SUCTIONS) ×3 IMPLANT
SUT BONE WAX W31G (SUTURE) ×3 IMPLANT
SUT VIC AB 0 CTB1 27 (SUTURE) ×6 IMPLANT
SUT VIC AB 1 CT1 27 (SUTURE) ×4
SUT VIC AB 1 CT1 27XBRD ANBCTR (SUTURE) ×2 IMPLANT
SUT VIC AB 2-0 CT1 27 (SUTURE) ×4
SUT VIC AB 2-0 CT1 TAPERPNT 27 (SUTURE) ×2 IMPLANT
SYR CONTROL 10ML LL (SYRINGE) ×3 IMPLANT
TOWEL OR 17X24 6PK STRL BLUE (TOWEL DISPOSABLE) ×3 IMPLANT
TOWEL OR 17X26 10 PK STRL BLUE (TOWEL DISPOSABLE) ×3 IMPLANT
TRAY FOLEY CATH 14FR (SET/KITS/TRAYS/PACK) ×3 IMPLANT
WATER STERILE IRR 1000ML POUR (IV SOLUTION) ×6 IMPLANT

## 2013-08-13 NOTE — Progress Notes (Signed)
Orthopedic Tech Progress Note Patient Details:  [name] 01-13-1946 [id number] On cpm at 8:35 pm LLE 0-60 Patient ID: [name], female   DOB: 01-13-1946, 68 y.o.   MRN: [id number]   [name], [name] 08/13/2013, 8:36 PM

## 2013-08-13 NOTE — H&P (Signed)
[name] MRN:  [id number] DOB/SEX:  06/23/1946/female  CHIEF COMPLAINT:  Painful left Knee  HISTORY: Patient is a 68 y.o. female presented with a history of pain in the left knee. Onset of symptoms was gradual starting several years ago with gradually worsening course since that time. Prior procedures on the knee include meniscectomy. Patient has been treated conservatively with over-the-counter NSAIDs and activity modification. Patient currently rates pain in the knee at 9 out of 10 with activity. There is pain at night.  PAST MEDICAL HISTORY: There are no active problems to display for this patient.  Past Medical History  Diagnosis Date  . Complication of anesthesia   . Arthritis   . Glaucoma   . Diverticulosis    Past Surgical History  Procedure Laterality Date  . Diagnostic laparoscopy  77    endometriosis  . Abdominal hysterectomy  84  . Tonsillectomy  87  . Back surgery  04,06  . Joint replacement Right 08,11    [email or web address]     MEDICATIONS:   Prescriptions prior to admission  Medication Sig Dispense Refill  . acetaminophen (TYLENOL) 500 MG tablet Take 500 mg by mouth every 6 (six) hours as needed for moderate pain.      Marland Kitchen aspirin EC 81 MG tablet Take 81 mg by mouth daily.      . brimonidine (ALPHAGAN P) 0.1 % SOLN Place 1 drop into the left eye every 12 (twelve) hours.      . Calcium Carb-Cholecalciferol (CALCIUM 600 + D PO) Take 1 tablet by mouth 2 (two) times daily.      Marland Kitchen docusate sodium (COLACE) 100 MG capsule Take 200 mg by mouth at bedtime.      . dorzolamide-timolol (COSOPT) 22.3-6.8 MG/ML ophthalmic solution Place 1 drop into both eyes every 12 (twelve) hours.      . famotidine (PEPCID) 20 MG tablet Take 20 mg by mouth daily as needed for indigestion.      . Flaxseed, Linseed, (FLAX SEED OIL) 1000 MG CAPS Take 1,000 mg by mouth daily.      . Glucosamine-Chondroit-Vit C-Mn (GLUCOSAMINE 1500 COMPLEX PO) Take 2 tablets by mouth 2 (two) times daily.      .  Liniments (SALONPAS EX) Apply 1 application topically daily as needed (for pain).      . meclizine (ANTIVERT) 25 MG tablet Take 25 mg by mouth 3 (three) times daily as needed (for motion sickness).      . Multiple Vitamin (MULTIVITAMIN WITH MINERALS) TABS tablet Take 1 tablet by mouth daily.      . polyethylene glycol (MIRALAX / GLYCOLAX) packet Take 17 g by mouth daily.      . Psyllium (METAMUCIL PO) Take 2 capsules by mouth 2 (two) times daily.        ALLERGIES:   Allergies  Allergen Reactions  . Codeine Nausea Only  . Keflex [Cephalexin] Rash    REVIEW OF SYSTEMS:  Pertinent items are noted in HPI.   FAMILY HISTORY:  History reviewed. No pertinent family history.  SOCIAL HISTORY:   History  Substance Use Topics  . Smoking status: Never Smoker   . Smokeless tobacco: Not on file  . Alcohol Use: No     EXAMINATION:  Vital signs in last 24 hours: Temp:  [97.1 F (36.2 C)] 97.1 F (36.2 C) (01/05 0557) Pulse Rate:  [70] 70 (01/05 0557) BP: (142)/(70) 142/70 mmHg (01/05 0557) SpO2:  [97 %] 97 % (01/05 0557)  General appearance: alert, cooperative  and no distress Lungs: clear to auscultation bilaterally Heart: regular rate and rhythm, S1, S2 normal, no murmur, click, rub or gallop Abdomen: soft, non-tender; bowel sounds normal; no masses,  no organomegaly Extremities: extremities normal, atraumatic, no cyanosis or edema and Homans sign is negative, no sign of DVT Pulses: 2+ and symmetric Skin: Skin color, texture, turgor normal. No rashes or lesions Neurologic: Alert and oriented X 3, normal strength and tone. Normal symmetric reflexes. Normal coordination and gait  Musculoskeletal:  ROM 0-115, Ligaments intact,  Imaging Review Plain radiographs demonstrate severe degenerative joint disease of the left knee. The overall alignment is mild valgus. The bone quality appears to be good for age and reported activity level.  Assessment/Plan: End stage arthritis, left knee    The patient history, physical examination and imaging studies are consistent with advanced degenerative joint disease of the left knee. The patient has failed conservative treatment.  The clearance notes were reviewed.  After discussion with the patient it was felt that Total Knee Replacement was indicated. The procedure,  risks, and benefits of total knee arthroplasty were presented and reviewed. The risks including but not limited to aseptic loosening, infection, blood clots, vascular injury, stiffness, patella tracking problems complications among others were discussed. The patient acknowledged the explanation, agreed to proceed with the plan.  Deavion Dobbs 08/13/2013, 6:58 AM

## 2013-08-13 NOTE — Anesthesia Preprocedure Evaluation (Addendum)
Anesthesia Evaluation  Patient identified by MRN, date of birth, ID band Patient awake    Reviewed: Allergy & Precautions, H&P , NPO status , Patient's Chart, lab work & pertinent test results  History of Anesthesia Complications (+) PROLONGED EMERGENCE  Airway Mallampati: I TM Distance: >3 FB Neck ROM: Full    Dental  (+) Teeth Intact and Dental Advisory Given   Pulmonary neg pulmonary ROS,          Cardiovascular negative cardio ROS      Neuro/Psych    GI/Hepatic diverticulitis   Endo/Other    Renal/GU      Musculoskeletal   Abdominal   Peds  Hematology   Anesthesia Other Findings   Reproductive/Obstetrics negative OB ROS                         Anesthesia Physical Anesthesia Plan  ASA: II  Anesthesia Plan: General   Post-op Pain Management:    Induction: Intravenous  Airway Management Planned: LMA  Additional Equipment:   Intra-op Plan:   Post-operative Plan: Extubation in OR  Informed Consent: I have reviewed the patients History and Physical, chart, labs and discussed the procedure including the risks, benefits and alternatives for the proposed anesthesia with the patient or authorized representative who has indicated his/her understanding and acceptance.     Plan Discussed with:   Anesthesia Plan Comments:        Anesthesia Quick Evaluation

## 2013-08-13 NOTE — Progress Notes (Signed)
Orthopedic Tech Progress Note Patient Details:  Maudie MercuryJudy D Bachand [birth year] [id number]  CPM Left Knee CPM Left Knee: On Left Knee Flexion (Degrees): 90 Left Knee Extension (Degrees): 0 Additional Comments: Trapeze bar foot roll   Shawnie PonsCammer, Chidubem Chaires Carol 08/13/2013, 11:22 AM

## 2013-08-13 NOTE — Anesthesia Procedure Notes (Addendum)
Procedure Name: LMA Insertion Date/Time: 08/13/2013 7:38 AM Performed by: Orvilla FusATO, SARAH A Pre-anesthesia Checklist: Patient identified, Timeout performed, Emergency Drugs available, Suction available and Patient being monitored Patient Re-evaluated:Patient Re-evaluated prior to inductionOxygen Delivery Method: Circle system utilized Preoxygenation: Pre-oxygenation with 100% oxygen Intubation Type: IV induction Ventilation: Mask ventilation without difficulty LMA: LMA inserted LMA Size: 4.0 Number of attempts: 1 Placement Confirmation: positive ETCO2 and breath sounds checked- equal and bilateral Tube secured with: Tape Dental Injury: Teeth and Oropharynx as per pre-operative assessment    Anesthesia Regional Block:  Femoral nerve block  Pre-Anesthetic Checklist: ,, timeout performed, Correct Patient, Correct Site, Correct Laterality, Correct Procedure, Correct Position, site marked, Risks and benefits discussed,  Surgical consent,  Pre-op evaluation,  At surgeon's request and post-op pain management  Laterality: Left  Prep: chloraprep and alcohol swabs       Needles:  Injection technique: Single-shot  Needle Type: Stimulator Needle - 80        Needle insertion depth: 4 cm   Additional Needles:  Procedures: nerve stimulator Femoral nerve block  Nerve Stimulator or Paresthesia:  Response: 0.5 mA, 0.1 ms, 4 cm  Additional Responses:   Narrative:  Start time: 08/13/2013 7:00 AM End time: 08/13/2013 7:05 AM Injection made incrementally with aspirations every 5 mL.  Performed by: Personally  Anesthesiologist: Maren BeachGregory E Ander Wamser MD  Additional Notes: Pt accepts procedure and risks. 12cc 0.5% Marcaine w/ epi w/o difficulty or discomfort. GES

## 2013-08-13 NOTE — Anesthesia Postprocedure Evaluation (Signed)
  Anesthesia Post-op Note  Patient: [name]  Procedure(s) Performed: Procedure(s): LEFT TOTAL KNEE ARTHROPLASTY (Left)  Patient Location: PACU  Anesthesia Type:General and GA combined with regional for post-op pain  Level of Consciousness: awake, oriented, sedated and patient cooperative  Airway and Oxygen Therapy: Patient Spontanous Breathing  Post-op Pain: mild  Post-op Assessment: Post-op Vital signs reviewed, Patient's Cardiovascular Status Stable, Respiratory Function Stable, Patent Airway, No signs of Nausea or vomiting and Pain level controlled  Post-op Vital Signs: stable  Complications: No apparent anesthesia complications

## 2013-08-13 NOTE — Progress Notes (Signed)
Patient became irate when I explained to her that she had to have the foam boot on when not in the CPM.  So far patient has complied with foam boot, it was just applied along with pain medication.

## 2013-08-13 NOTE — Evaluation (Signed)
Physical Therapy Evaluation Patient Details Name: [name] MRN: [id number] DOB: November 23, 1945 Today's Date: 08/13/2013 Time: 7829-5621 PT Time Calculation (min): 27 min  PT Assessment / Plan / Recommendation History of Present Illness  s/p LTKA  Clinical Impression  Pt is s/p TKA resulting in the deficits listed below (see PT Problem List). Pt will benefit from skilled PT to increase their independence and safety with mobility to allow discharge to the venue listed below.      PT Assessment  Patient needs continued PT services    Follow Up Recommendations  Home health PT;Supervision/Assistance - 24 hour    Does the patient have the potential to tolerate intense rehabilitation      Barriers to Discharge        Equipment Recommendations  Rolling walker with 5" wheels;3in1 (PT)    Recommendations for Other Services OT consult   Frequency 7X/week    Precautions / Restrictions Precautions Precautions: Knee Restrictions Weight Bearing Restrictions: Yes LLE Weight Bearing: Weight bearing as tolerated   Pertinent Vitals/Pain 4/10 L knee, patient repositioned for comfort and optimal knee extension Pt also naueseated; RN informed      Mobility  Bed Mobility Bed Mobility: Supine to Sit;Sitting - Scoot to Edge of Bed Supine to Sit: 3: Mod assist;With rails Sitting - Scoot to Edge of Bed: 4: Min assist;With rail Details for Bed Mobility Assistance: Cues for technique Transfers Transfers: Sit to Stand;Stand to Sit Sit to Stand: 3: Mod assist Stand to Sit: 4: Min assist;To chair/3-in-1 Details for Transfer Assistance: jCues for safety, hand placeement snd technique Ambulation/Gait Ambulation/Gait Assistance: 4: Min assist;3: Mod assist Ambulation Distance (Feet): 10 Feet Assistive device: Rolling walker Ambulation/Gait Assistance Details: cues for sequence and to activate quad for l stance stability; mod asssist to guard L snee Gait Pattern: Step-to pattern    Exercises  Total Joint Exercises Quad Sets: AROM;Left;5 reps Heel Slides: AROM;Left;5 reps   PT Diagnosis: Difficulty walking;Acute pain  PT Problem List: Decreased strength;Decreased range of motion;Decreased activity tolerance;Decreased balance;Decreased knowledge of use of DME;Decreased knowledge of precautions;Pain PT Treatment Interventions: DME instruction;Gait training;Stair training;Functional mobility training;Therapeutic activities;Therapeutic exercise;Patient/family education     PT Goals(Current goals can be found in the care plan section) Acute Rehab PT Goals Patient Stated Goal: to walk without pain PT Goal Formulation: With patient Time For Goal Achievement: 08/20/13 Potential to Achieve Goals: Good  Visit Information  Last PT Received On: 08/13/13 Assistance Needed: +1 History of Present Illness: s/p LTKA       Prior Functioning  Home Living Family/patient expects to be discharged to:: Private residence Living Arrangements: Spouse/significant other Available Help at Discharge: Family;Available 24 hours/day Type of Home: House Home Access: Stairs to enter [employer] of Steps: 1 Entrance Stairs-Rails: None Home Layout: Two level Alternate Level Stairs-Number of Steps: Flight (7+7) Alternate Level Stairs-Rails: Left;Right (after landing rail on just one side) Home Equipment: Walker - 2 wheels;Bedside commode (Need to verify) Prior Function Level of Independence: Independent Communication Communication: No difficulties    Cognition  Cognition Arousal/Alertness: Awake/alert Behavior During Therapy: WFL for tasks assessed/performed Overall Cognitive Status: Within Functional Limits for tasks assessed    Extremity/Trunk Assessment Upper Extremity Assessment Upper Extremity Assessment: Overall WFL for tasks assessed Lower Extremity Assessment Lower Extremity Assessment: LLE deficits/detail LLE Deficits / Details: Decr wuad strentght postop   Balance     End of Session PT - End of Session Equipment Utilized During Treatment: Gait belt Activity Tolerance: Patient tolerated treatment well Patient  left: in chair;with call bell/phone within reach;with family/visitor present Nurse Communication: Mobility status;Other (comment) (pt nauseated)  GP     Van ClinesGarrigan, Avrey Flanagin [facility] McLeanHolly Albertina Leise, [place] CarolinaPT [phone]  08/13/2013, 6:52 PM

## 2013-08-13 NOTE — Progress Notes (Signed)
Orthopedic Tech Progress Note Patient Details:  Maudie MercuryJudy D Noblett Jun 15, 1946 [id number]  Patient ID: Maudie MercuryJudy D Rullo, female   DOB: Jun 15, 1946, 68 y.o.   MRN: [id number]   Shawnie PonsCammer, Nikkia Devoss Carol 08/13/2013, 11:22 AMTrapeze bar and foot roll

## 2013-08-13 NOTE — Progress Notes (Signed)
Utilization review completed.  

## 2013-08-13 NOTE — Transfer of Care (Signed)
Immediate Anesthesia Transfer of Care Note  Patient: [name]  Procedure(s) Performed: Procedure(s): LEFT TOTAL KNEE ARTHROPLASTY (Left)  Patient Location: PACU  Anesthesia Type:General  Level of Consciousness: awake, alert  and oriented  Airway & Oxygen Therapy: Patient Spontanous Breathing and Patient connected to nasal cannula oxygen  Post-op Assessment: Report given to PACU RN, Post -op Vital signs reviewed and stable and Patient moving all extremities  Post vital signs: Reviewed and stable  Complications: No apparent anesthesia complications

## 2013-08-14 LAB — BASIC METABOLIC PANEL
BUN: 14 mg/dL (ref 6–23)
CO2: 28 mEq/L (ref 19–32)
Calcium: 8.6 mg/dL (ref 8.4–10.5)
Chloride: 98 mEq/L (ref 96–112)
Creatinine, Ser: 0.75 mg/dL (ref 0.50–1.10)
GFR, EST NON AFRICAN AMERICAN: 86 mL/min — AB (ref >90)
Glucose, Bld: 114 mg/dL — ABNORMAL HIGH (ref 70–99)
POTASSIUM: 4.5 meq/L (ref 3.7–5.3)
SODIUM: 136 meq/L — AB (ref 137–147)

## 2013-08-14 LAB — CBC
HCT: 36.4 % (ref 36.0–46.0)
Hemoglobin: 11.8 g/dL — ABNORMAL LOW (ref 12.0–15.0)
MCH: 30.3 pg (ref 26.0–34.0)
MCHC: 32.4 g/dL (ref 30.0–36.0)
MCV: 93.3 fL (ref 78.0–100.0)
PLATELETS: 188 10*3/uL (ref 150–400)
RBC: 3.9 MIL/uL (ref 3.87–5.11)
RDW: 13.8 % (ref 11.5–15.5)
WBC: 7.7 10*3/uL (ref 4.0–10.5)

## 2013-08-14 MED ORDER — HYDROMORPHONE HCL 2 MG PO TABS: 2.0000 mg | ORAL_TABLET | Freq: Four times a day (QID) | ORAL | Status: DC | PRN

## 2013-08-14 MED ORDER — CELECOXIB 200 MG PO CAPS: 200.0000 mg | ORAL_CAPSULE | Freq: Two times a day (BID) | ORAL | Status: DC

## 2013-08-14 MED ORDER — METHOCARBAMOL 500 MG PO TABS: 500.0000 mg | ORAL_TABLET | Freq: Four times a day (QID) | ORAL | Status: DC | PRN

## 2013-08-14 MED ORDER — ENOXAPARIN SODIUM 40 MG/0.4ML ~~LOC~~ SOLN: 40.0000 mg | SUBCUTANEOUS | Status: DC

## 2013-08-14 NOTE — Progress Notes (Signed)
SPORTS MEDICINE AND JOINT REPLACEMENT  Georgena SpurlingStephen Lucey, MD   Altamese CabalMaurice Avary Pitsenbarger, PA-C [address], [place], [place]  [address]                             [phone]   PROGRESS NOTE  Subjective:  negative for Chest Pain  negative for Shortness of Breath  negative for Nausea/Vomiting   negative for Calf Pain  negative for Bowel Movement   Tolerating Diet: yes         Patient reports pain as 5 on 0-10 scale.    Objective: Vital signs in last 24 hours:   Patient Vitals for the past 24 hrs:  BP Temp Pulse Resp SpO2 Height Weight  08/14/13 0607 125/76 mmHg 98.7 F (37.1 C) 82 18 99 % - -  08/13/13 2100 124/72 mmHg 98.6 F (37 C) 86 18 98 % - -  08/13/13 1559 - - - 18 98 % - -  08/13/13 1445 - - - - - 5\' 3"  (1.6 m) 85.73 kg (189 lb)  08/13/13 1245 118/69 mmHg - 49 17 98 % - -  08/13/13 1230 126/75 mmHg - 59 17 98 % - -  08/13/13 1215 122/66 mmHg - 51 16 97 % - -  08/13/13 1200 107/62 mmHg - 50 13 97 % - -  08/13/13 1145 117/63 mmHg - 51 11 97 % - -  08/13/13 1130 116/66 mmHg - 52 16 97 % - -  08/13/13 1115 117/62 mmHg - 52 21 96 % - -  08/13/13 1100 127/68 mmHg - 54 20 95 % - -  08/13/13 1045 130/75 mmHg - 56 12 96 % - -  08/13/13 1030 128/69 mmHg - 71 15 96 % - -  08/13/13 1015 144/73 mmHg - 63 25 94 % - -  08/13/13 1000 147/81 mmHg - 80 14 96 % - -  08/13/13 0945 137/78 mmHg - 69 16 93 % - -  08/13/13 0940 - 98 F (36.7 C) 75 18 92 % - -    @flow {1959:LAST@   Intake/Output from previous day:   01/05 0701 - 01/06 0700 In: 1700 [I.V.:1700] Out: 2575 [Urine:2125; Drains:425]   Intake/Output this shift:       Intake/Output     01/05 0701 - 01/06 0700 01/06 0701 - 01/07 0700   I.V. (mL/kg) 1700 (19.8)    Total Intake(mL/kg) 1700 (19.8)    Urine (mL/kg/hr) 2125 (1)    Drains 425 (0.2)    Blood 25 (0)    Total Output 2575     Net -875             LABORATORY DATA:  Recent Labs  08/13/13 1330 08/14/13 0600  WBC 7.3 7.7  HGB 12.5 11.8*  HCT 36.4 36.4   PLT 210 188    Recent Labs  08/13/13 1330 08/14/13 0600  NA  --  136*  K  --  4.5  CL  --  98  CO2  --  28  BUN  --  14  CREATININE 0.59 0.75  GLUCOSE  --  114*  CALCIUM  --  8.6   Lab Results  Component Value Date   INR 1.03 08/06/2013   INR 1.02 09/04/2009    Examination:  General appearance: alert, cooperative and no distress Extremities: Homans sign is negative, no sign of DVT  Wound Exam: clean, dry, intact   Drainage:  None: wound tissue  dry  Motor Exam: EHL and FHL Intact  Sensory Exam: Deep Peroneal normal   Assessment:    1 Day Post-Op  Procedure(s) (LRB): LEFT TOTAL KNEE ARTHROPLASTY (Left)  ADDITIONAL DIAGNOSIS:  Active Problems:   S/P total knee replacement  Acute Blood Loss Anemia   Plan: Physical Therapy as ordered Weight Bearing as Tolerated (WBAT)  DVT Prophylaxis:  Lovenox  DISCHARGE PLAN: Home  DISCHARGE NEEDS: HHPT, CPM, Walker and 3-in-1 comode seat         [name] 08/14/2013, 7:44 AM

## 2013-08-14 NOTE — Discharge Instructions (Signed)
Diet: As you were doing prior to hospitalization   Activity:  Increase activity slowly as tolerated                  No lifting or driving for 6 weeks  Shower:  May shower without a dressing once there is no drainage from your wound.                 Do NOT wash over the wound.                 Dressing:  You may change your dressing on Wednesday                    Then change the dressing daily with sterile 4"x4"s gauze dressing                     And TED hose for knees.  Weight Bearing:  Weight bearing as tolerated as taught in physical therapy.  Use a                                walker or Crutches as instructed.  To prevent constipation: you may use a stool softener such as -               Colace ( over the counter) 100 mg by mouth twice a day                Drink plenty of fluids ( prune juice may be helpful) and high fiber foods                Miralax ( over the counter) for constipation as needed.    Precautions:  If you experience chest pain or shortness of breath - call 911 immediately               For transfer to the hospital emergency department!!               If you develop a fever greater that 101 F, purulent drainage from wound,                             increased redness or drainage from wound, or calf pain -- Call the office.  Follow- Up Appointment:  Please call for an appointment to be seen on 08/28/13                                              [facility] office:  [phone]            [address] [place], [place] [address]

## 2013-08-14 NOTE — Op Note (Signed)
TOTAL KNEE REPLACEMENT OPERATIVE NOTE:  08/13/2013  7:56 AM  PATIENT:  [name]  68 y.o. female  PRE-OPERATIVE DIAGNOSIS:  osteoarthritis left knee  POST-OPERATIVE DIAGNOSIS:  osteoarthritis left knee  PROCEDURE:  Procedure(s): LEFT TOTAL KNEE ARTHROPLASTY  SURGEON:  Surgeon(s): Dannielle Huh, MD  PHYSICIAN ASSISTANT: Altamese Cabal, [facility]  ANESTHESIA:   general  DRAINS: Hemovac  SPECIMEN: None  COUNTS:  Correct  TOURNIQUET:   Total Tourniquet Time Documented: Thigh (Left) - 71 minutes Total: Thigh (Left) - 71 minutes   DICTATION:  Indication for procedure:    The patient is a 68 y.o. female who has failed conservative treatment for osteoarthritis left knee.  Informed consent was obtained prior to anesthesia. The risks versus benefits of the operation were explain and in a way the patient can, and did, understand.   On the implant demand matching protocol, this patient scored 9.  Therefore, this patient was not receive a polyethylene insert with vitamin E which is a high demand implant.  Description of procedure:     The patient was taken to the operating room and placed under anesthesia.  The patient was positioned in the usual fashion taking care that all body parts were adequately padded and/or protected.  I foley catheter was not placed.  A tourniquet was applied and the leg prepped and draped in the usual sterile fashion.  The extremity was exsanguinated with the esmarch and tourniquet inflated to 350 mmHg.  Pre-operative range of motion was normal.  The knee was in 5 degree of mild valgus.  A midline incision approximately 6-7 inches long was made with a #10 blade.  A new blade was used to make a parapatellar arthrotomy going 2-3 cm into the quadriceps tendon, over the patella, and alongside the medial aspect of the patellar tendon.  A synovectomy was then performed with the #10 blade and forceps. I then elevated the deep MCL off the medial tibial metaphysis  subperiosteally around to the semimembranosus attachment.    I everted the patella and used calipers to measure patellar thickness.  I used the reamer to ream down to appropriate thickness to recreate the native thickness.  I then removed excess bone with the rongeur and sagittal saw.  I used the appropriately sized template and drilled the three lug holes.  I then put the trial in place and measured the thickness with the calipers to ensure recreation of the native thickness.  The trial was then removed and the patella subluxed and the knee brought into flexion.  A homan retractor was place to retract and protect the patella and lateral structures.  A Z-retractor was place medially to protect the medial structures.  The extra-medullary alignment system was used to make cut the tibial articular surface perpendicular to the anamotic axis of the tibia and in 3 degrees of posterior slope.  The cut surface and alignment jig was removed.  I then used the intramedullary alignment guide to make a 4 valgus cut on the distal femur.  I then marked out the epicondylar axis on the distal femur.  The posterior condylar axis measured 5 degrees.  I then used the anterior referencing sizer and measured the femur to be a size 8.  The 4-In-1 cutting block was screwed into place in external rotation matching the posterior condylar angle, making our cuts perpendicular to the epicondylar axis.  Anterior, posterior and chamfer cuts were made with the sagittal saw.  The cutting block and cut pieces were removed.  A lamina spreader was placed in 90 degrees of flexion.  The ACL, PCL, menisci, and posterior condylar osteophytes were removed.  A 12 mm spacer blocked was found to offer good flexion and extension gap balance after severe in degree releasing.   The scoop retractor was then placed and the femoral finishing block was pinned in place.  The small sagittal saw was used as well as the lug drill to finish the femur.  The block  and cut surfaces were removed and the medullary canal hole filled with autograft bone from the cut pieces.  The tibia was delivered forward in deep flexion and external rotation.  A size D tray was selected and pinned into place centered on the medial 1/3 of the tibial tubercle.  The reamer and keel was used to prepare the tibia through the tray.    I then trialed with the size 8 femur, size D tibia, a 12 mm insert and the 32 patella.  I had excellent flexion/extension gap balance, excellent patella tracking.  Flexion was full and beyond 120 degrees; extension was zero.  These components were chosen and the staff opened them to me on the back table while the knee was lavaged copiously and the cement mixed.  The soft tissue was infiltrated with 60cc of exparel 1.3% through a 21 gauge needle.  I cemented in the components and removed all excess cement.  The polyethylene tibial component was snapped into place and the knee placed in extension while cement was hardening.  The capsule was infilltrated with 30cc of .25% Marcaine with epinephrine.  A hemovac was place in the joint exiting superolaterally.  A pain pump was place superomedially superficial to the arthrotomy.  Once the cement was hard, the tourniquet was let down.  Hemostasis was obtained.  The arthrotomy was closed with figure-8 #1 vicryl sutures.  The deep soft tissues were closed with #0 vicryls and the subcuticular layer closed with a running #2-0 vicryl.  The skin was reapproximated and closed with skin staples.  The wound was dressed with xeroform, 4 x4's, 2 ABD sponges, a single layer of webril and a TED stocking.   The patient was then awakened, extubated, and taken to the recovery room in stable condition.  BLOOD LOSS:  300cc DRAINS: 1 hemovac, 1 pain catheter COMPLICATIONS:  None.  PLAN OF CARE: Admit to inpatient   PATIENT DISPOSITION:  PACU - hemodynamically stable.   Delay start of Pharmacological VTE agent (>24hrs) due to  surgical blood loss or risk of bleeding:  not applicable  Please fax a copy of this op note to my office at [phone] (please only include page 1 and 2 of the Case Information op note)

## 2013-08-14 NOTE — Progress Notes (Signed)
Pt discharged to home accompanied by her husband 

## 2013-08-14 NOTE — Progress Notes (Signed)
Physical Therapy Treatment Patient Details Name: Ruth MercuryJudy D Hightower MRN: [id number] DOB: 09-27-1945 Today's Date: 08/14/2013 Time: [id number] PT Time Calculation (min): 29 min  PT Assessment / Plan / Recommendation  History of Present Illness s/p LTKA   PT Comments   Making very good progress; Will likely be able to dc home today Patient needs to practice stairs next session.     Follow Up Recommendations  Home health PT;Supervision/Assistance - 24 hour     Does the patient have the potential to tolerate intense rehabilitation     Barriers to Discharge        Equipment Recommendations  Other (comment) (has all needed equipment)    Recommendations for Other Services OT consult  Frequency 7X/week   Progress towards PT Goals Progress towards PT goals: Progressing toward goals  Plan Current plan remains appropriate    Precautions / Restrictions Precautions Precautions: Knee Restrictions Weight Bearing Restrictions: Yes LLE Weight Bearing: Weight bearing as tolerated   Pertinent Vitals/Pain 4/10 L knee; patient repositioned for comfort     Mobility  Bed Mobility Bed Mobility: Supine to Sit;Sitting - Scoot to Edge of Bed Supine to Sit: With rails;4: Min guard Sitting - Scoot to [place] of Bed: With rail;5: Supervision Details for Bed Mobility Assistance: Cues for technique Transfers Transfers: Sit to Stand;Stand to Sit Sit to Stand: 4: Min assist;From bed;From chair/3-in-1;With armrests Stand to Sit: 4: Min assist;To chair/3-in-1;4: Min guard Details for Transfer Assistance: jCues for safety, hand placeement snd technique; Improving rise to stand Ambulation/Gait Ambulation/Gait Assistance: 4: Min assist Ambulation Distance (Feet): 55 Feet Assistive device: Rolling walker Ambulation/Gait Assistance Details: Cues to self-monitor for L knee stance stability; One LOB to Right, from which pt recovered with hand on counter without physical assist Gait Pattern: Step-to  pattern;Step-through pattern General Gait Details: emerging step-through Stairs:  (plan for this afternoon)    Exercises     PT Diagnosis:    PT Problem List:   PT Treatment Interventions:     PT Goals (current goals can now be found in the care plan section) Acute Rehab PT Goals Patient Stated Goal: to walk without pain PT Goal Formulation: With patient Time For Goal Achievement: 08/20/13 Potential to Achieve Goals: Good  Visit Information  Last PT Received On: 08/14/13 Assistance Needed: +1 History of Present Illness: s/p LTKA    Subjective Data  Subjective: Wants to try to get to bathroom Patient Stated Goal: to walk without pain   Cognition  Cognition Arousal/Alertness: Awake/alert Behavior During Therapy: WFL for tasks assessed/performed Overall Cognitive Status: Within Functional Limits for tasks assessed    Balance  Balance Balance Assessed: Yes Static Standing Balance Static Standing - Balance Support: Right upper extremity supported;Left upper extremity supported Static Standing - Level of Assistance: 5: Stand by assistance  End of Session PT - End of Session Equipment Utilized During Treatment: Gait belt Activity Tolerance: Patient tolerated treatment well Patient left: Other (comment) (with OT) Nurse Communication: Mobility status CPM Left Knee CPM Left Knee: Off   GP     Van ClinesGarrigan, Saladin Petrelli [place] Nare [place], [place] [phone]  08/14/2013, 2:01 PM

## 2013-08-14 NOTE — Progress Notes (Signed)
Patient received a break from the foam boot and dangled feet from side of the bed.  Pain medicine provided.

## 2013-08-14 NOTE — Evaluation (Signed)
Occupational Therapy Evaluation Patient Details Name: [name] MRN: [id number] DOB: [birth year] Today's Date: 08/14/2013 Time: 4098-1191 OT Time Calculation (min): 34 min  OT Assessment / Plan / Recommendation History of present illness s/p LTKA   Clinical Impression   Pt is currently min assist level for functional transfers and selfcare tasks sit to stand with min assist.  Feel pt will have adequate supervision from her husband Ruth discharge and has all needed DME.  Would expect her to reach supervision level for most selfcare tasks within the next day.  No further OT needs.  Pt has been educated on safety, use of DME, and performance of ADLs.  No further OT needs Ruth this time.    OT Assessment  Patient does not need any further OT services    Follow Up Recommendations  No OT follow up;Supervision/Assistance - 24 hour       Equipment Recommendations  None recommended by OT          Precautions / Restrictions Precautions Precautions: Knee Restrictions Weight Bearing Restrictions: No LLE Weight Bearing: Weight bearing as tolerated   Pertinent Vitals/Pain Pain 2/10 on the faces scale in the left knee.  Meds given by nursing.    ADL  Eating/Feeding: Simulated;Independent Where Assessed - Eating/Feeding: Chair Grooming: Simulated;Supervision/safety Where Assessed - Grooming: Unsupported sitting Upper Body Bathing: Simulated;Set up Where Assessed - Upper Body Bathing: Unsupported sitting Lower Body Bathing: Simulated;Minimal assistance Where Assessed - Lower Body Bathing: Supported sit to stand Upper Body Dressing: Simulated;Set up Where Assessed - Upper Body Dressing: Unsupported sitting Lower Body Dressing: Simulated;Set up Where Assessed - Lower Body Dressing: Unsupported sitting Toilet Transfer: Simulated;Minimal assistance Toilet Transfer Method: Other (comment) (ambulate with the RW) Toilet Transfer Equipment: Bedside commode Toileting - Clothing Manipulation and  Hygiene: Simulated;Minimal assistance Where Assessed - Toileting Clothing Manipulation and Hygiene: Sit to stand from 3-in-1 or toilet Tub/Shower Transfer: Simulated;Min guard Tub/Shower Transfer Method: Ambulating Equipment Used: Gait belt;Rolling walker Transfers/Ambulation Related to ADLs: Pt is currenlty min assist to min guard assist for mobility using the RW.  Needs cueing to maintain trunk extension as well as hip extension as pt tends to demonstrate some flexion with weightbearing over the RLE. ADL Comments: Pt with slight difficulty reaching her left foot for donning her sandal but able to perform with increased time.  She reports having AE Ruth home as well but discussed with therapist that she likely will not need after a few days for dressing tasks.  Practiced walk-in shower transfers during session as pt reports having a walk-in shower and shower seat as well.      Visit Information  Last OT Received On: 08/14/13 Assistance Needed: +1 History of Present Illness: s/p LTKA       Prior Functioning     Home Living Family/patient expects to be discharged to:: Private residence Living Arrangements: Spouse/significant other Available Help Ruth Discharge: Family;Available 24 hours/day Type of Home: House Home Access: Stairs to enter [employer] of Steps: 1 Entrance Stairs-Rails: None Home Layout: Two level Alternate Level Stairs-Number of Steps: Flight (7+7) Alternate Level Stairs-Rails: Left;Right (after landing rail on just one side) Home Equipment: Walker - 2 wheels;Bedside commode;Shower seat (Need to verify) Prior Function Level of Independence: Independent Communication Communication: No difficulties Dominant Hand: Left         Vision/Perception Vision - History Baseline Vision: No visual deficits Patient Visual Report: No change from baseline Vision - Assessment Eye Alignment: Within Functional Limits Vision Assessment: Vision not  tested Perception Perception: Within Functional Limits Praxis Praxis: Intact   Cognition  Cognition Arousal/Alertness: Awake/alert Behavior During Therapy: WFL for tasks assessed/performed Overall Cognitive Status: Within Functional Limits for tasks assessed    Extremity/Trunk Assessment Upper Extremity Assessment Upper Extremity Assessment: Overall WFL for tasks assessed Cervical / Trunk Assessment Cervical / Trunk Assessment: Normal     Mobility Transfers Transfers: Sit to Stand Sit to Stand: 4: Min assist;From bed;With upper extremity assist Stand to Sit: 4: Min assist;With upper extremity assist;To bed        Balance Balance Balance Assessed: Yes Static Standing Balance Static Standing - Balance Support: Right upper extremity supported;Left upper extremity supported Static Standing - Level of Assistance: 5: Stand by assistance   End of Session OT - End of Session Equipment Utilized During Treatment: Gait belt;Rolling walker Activity Tolerance: Patient tolerated treatment well Patient left: in chair;with call bell/phone within reach Nurse Communication: Mobility status CPM Left Knee CPM Left Knee: Off      Atianna [name] OTR/L 08/14/2013, 11:08 AM

## 2013-08-14 NOTE — Progress Notes (Signed)
Physical Therapy Treatment Note  Making good gains in mobility; Stair training done;  OK for dc home from PT standpoint  4-5/10 pain; patient repositioned for comfort and optimal knee ext   08/14/13 1600  PT Visit Information  Last PT Received On 08/14/13  Assistance Needed +1  History of Present Illness s/p LTKA  PT Time Calculation  PT Start Time 1424  PT Stop Time 1511  PT Time Calculation (min) 47 min  Subjective Data  Subjective Wants to try to get to bathroom  Patient Stated Goal to walk without pain  Precautions  Precautions Knee  Restrictions  Weight Bearing Restrictions Yes  LLE Weight Bearing WBAT  Cognition  Arousal/Alertness Awake/alert  Behavior During Therapy WFL for tasks assessed/performed  Overall Cognitive Status Within Functional Limits for tasks assessed  Bed Mobility  Bed Mobility Supine to Sit;Sitting - Scoot to Edge of Bed  Supine to Sit With rails;5: Supervision  Sitting - Scoot to Edge of Bed With rail;5: Supervision  Details for Bed Mobility Assistance Cues for technique  Transfers  Transfers Sit to Stand;Stand to Sit  Sit to Stand From bed;From chair/3-in-1;With armrests;4: Min guard  Stand to Sit To chair/3-in-1;4: Min guard  Details for Transfer Assistance jCues for safety, hand placeement snd technique; Improving rise to stand, wit less dependence on momentum  Ambulation/Gait  Ambulation/Gait Assistance 4: Min guard  Ambulation Distance (Feet) 120 Feet  Assistive device Rolling walker  Ambulation/Gait Assistance Details Overall improving steps and no LOB noted  Gait Pattern Step-to pattern;Step-through pattern  General Gait Details emerging step-through  Stairs Yes  Stairs Assistance 4: Min guard  Stair Management Technique One rail Left;Step to pattern  Number of Stairs 12 (verbal and demo cues for technique)  PT - End of Session  Equipment Utilized During Treatment Gait belt  Activity Tolerance Patient tolerated treatment well   Patient left Other (comment) (with OT)  Nurse Communication Mobility status  PT - Assessment/Plan  PT Plan Current plan remains appropriate  PT Frequency 7X/week  Recommendations for Other Services OT consult  Follow Up Recommendations Home health PT;Supervision/Assistance - 24 hour  PT equipment Other (comment) (has all needed equipment)  Acute Rehab PT Goals  PT Goal Formulation With patient  Time For Goal Achievement 08/20/13  Potential to Achieve Goals Good  PT General Charges  $$ ACUTE PT VISIT 1 Procedure  PT Treatments  $Gait Training 23-37 mins  $Therapeutic Activity 8-22 mins   LaporteHolly Auburn [place], [place] Carolina[place] [phone]

## 2013-08-14 NOTE — Progress Notes (Signed)
Patient currently lying in bed; foam boot back on left foot.

## 2013-08-16 NOTE — Discharge Summary (Signed)
SPORTS MEDICINE & JOINT REPLACEMENT   Georgena Spurling, MD   Altamese Cabal, PA-C [address] [place], [place], [place]  [address]                             [phone]  PATIENT ID: [name]        MRN:  [id number]          DOB/AGE: [birth year] / 67 y.o.    DISCHARGE SUMMARY  ADMISSION DATE:    08/13/2013 DISCHARGE DATE:   08/14/2013  ADMISSION DIAGNOSIS: osteoarthritis left knee    DISCHARGE DIAGNOSIS:  osteoarthritis left knee    ADDITIONAL DIAGNOSIS: Active Problems:   S/P total knee replacement  Past Medical History  Diagnosis Date  . Complication of anesthesia   . Arthritis   . Glaucoma   . Diverticulosis     PROCEDURE: Procedure(s): LEFT TOTAL KNEE ARTHROPLASTY on 08/13/2013  CONSULTS:     HISTORY:  See H&P in chart  HOSPITAL COURSE:  [name] is a 68 y.o. admitted on 08/13/2013 and found to have a diagnosis of osteoarthritis left knee.  After appropriate laboratory studies were obtained  they were taken to the operating room on 08/13/2013 and underwent Procedure(s): LEFT TOTAL KNEE ARTHROPLASTY.   They were given perioperative antibiotics:  Anti-infectives   Start     Dose/Rate Route Frequency Ordered Stop   08/13/13 1400  ceFAZolin (ANCEF) IVPB 1 g/50 mL premix     1 g 100 mL/hr over 30 Minutes Intravenous Every 6 hours 08/13/13 1308 08/13/13 2126   08/13/13 0600  ceFAZolin (ANCEF) IVPB 2 g/50 mL premix     2 g 100 mL/hr over 30 Minutes Intravenous On call to O.R. 08/12/13 1608 08/13/13 0741    .  Tolerated the procedure well.  Placed with a foley intraoperatively.  Given Ofirmev at induction and for 48 hours.    POD# 1: Vital signs were stable.  Patient denied Chest pain, shortness of breath, or calf pain.  Patient was started on Lovenox 30 mg subcutaneously twice daily at 8am.  Consults to PT, OT, and care management were made.  The patient was weight bearing as tolerated.  CPM was placed on the operative leg 0-90 degrees for 6-8 hours a day.   Incentive spirometry was taught.  Dressing was changed.  Marcaine pump and hemovac were discontinued.      POD #2, Continued  PT for ambulation and exercise program.  IV saline locked.  O2 discontinued.    The remainder of the hospital course was dedicated to ambulation and strengthening.   The patient was discharged on 1 day post op in  Good condition.  Blood products given:none  DIAGNOSTIC STUDIES: Recent vital signs: No data found.      Recent laboratory studies:  Recent Labs  08/13/13 1330 08/14/13 0600  WBC 7.3 7.7  HGB 12.5 11.8*  HCT 36.4 36.4  PLT 210 188    Recent Labs  08/13/13 1330 08/14/13 0600  NA  --  136*  K  --  4.5  CL  --  98  CO2  --  28  BUN  --  14  CREATININE 0.59 0.75  GLUCOSE  --  114*  CALCIUM  --  8.6   Lab Results  Component Value Date   INR 1.03 08/06/2013   INR 1.02 09/04/2009     Recent Radiographic Studies :  Dg Chest 2 View  08/06/2013   CLINICAL DATA:  Preoperative chest x-ray. History of arthritis and diverticulosis.  EXAM: CHEST  2 VIEW  COMPARISON:  09/04/2009.  FINDINGS: Lungs are clear. Heart size mediastinum unremarkable. No pleural effusion pneumothorax. Prior lumbar spine fusion. Severe degenerative changes thoracic spine.  IMPRESSION: No active cardiopulmonary disease.   Electronically Signed   By: Maisie Fushomas  Register   On: 08/06/2013 15:36    DISCHARGE INSTRUCTIONS: Discharge Orders   Future Orders Complete By Expires   Call MD / Call 911  As directed    Comments:     If you experience chest pain or shortness of breath, CALL 911 and be transported to the hospital emergency room.  If you develope a fever above 101 F, pus (white drainage) or increased drainage or redness at the wound, or calf pain, call your surgeon's office.   Change dressing  As directed    Comments:     Change dressing on wednesday, then change the dressing daily with sterile 4 x 4 inch gauze dressing and apply TED hose.   Constipation Prevention  As  directed    Comments:     Drink plenty of fluids.  Prune juice may be helpful.  You may use a stool softener, such as Colace (over the counter) 100 mg twice a day.  Use MiraLax (over the counter) for constipation as needed.   CPM  As directed    Comments:     Continuous passive motion machine (CPM):      Use the CPM from 0 to 90 for 6-8 hours per day.      You may increase by 10 per day.  You may break it up into 2 or 3 sessions per day.      Use CPM for 2 weeks or until you are told to stop.   Diet - low sodium heart healthy  As directed    Do not put a pillow under the knee. Place it under the heel.  As directed    Driving restrictions  As directed    Comments:     No driving for 6 weeks   Increase activity slowly as tolerated  As directed    Lifting restrictions  As directed    Comments:     No lifting for 6 weeks   TED hose  As directed    Comments:     Use stockings (TED hose) for 3 weeks on both leg(s).  You may remove them at night for sleeping.      DISCHARGE MEDICATIONS:     Medication List    STOP taking these medications       aspirin EC 81 MG tablet      TAKE these medications       acetaminophen 500 MG tablet  Commonly known as:  TYLENOL  Take 500 mg by mouth every 6 (six) hours as needed for moderate pain.     brimonidine 0.1 % Soln  Commonly known as:  ALPHAGAN P  Place 1 drop into the left eye every 12 (twelve) hours.     CALCIUM 600 + D PO  Take 1 tablet by mouth 2 (two) times daily.     celecoxib 200 MG capsule  Commonly known as:  CELEBREX  Take 1 capsule (200 mg total) by mouth 2 (two) times daily.     docusate sodium 100 MG capsule  Commonly known as:  COLACE  Take 200 mg by mouth at bedtime.     dorzolamide-timolol  22.3-6.8 MG/ML ophthalmic solution  Commonly known as:  COSOPT  Place 1 drop into both eyes every 12 (twelve) hours.     enoxaparin 40 MG/0.4ML injection  Commonly known as:  LOVENOX  Inject 0.4 mLs (40 mg total) into the  skin daily.     famotidine 20 MG tablet  Commonly known as:  PEPCID  Take 20 mg by mouth daily as needed for indigestion.     Flax Seed Oil 1000 MG Caps  Take 1,000 mg by mouth daily.     GLUCOSAMINE 1500 COMPLEX PO  Take 2 tablets by mouth 2 (two) times daily.     HYDROmorphone 2 MG tablet  Commonly known as:  DILAUDID  Take 1-2 tablets (2-4 mg total) by mouth every 6 (six) hours as needed for moderate pain or severe pain.     meclizine 25 MG tablet  Commonly known as:  ANTIVERT  Take 25 mg by mouth 3 (three) times daily as needed (for motion sickness).     METAMUCIL PO  Take 2 capsules by mouth 2 (two) times daily.     methocarbamol 500 MG tablet  Commonly known as:  ROBAXIN  Take 1-2 tablets (500-1,000 mg total) by mouth every 6 (six) hours as needed for muscle spasms.     multivitamin with minerals Tabs tablet  Take 1 tablet by mouth daily.     polyethylene glycol packet  Commonly known as:  MIRALAX / GLYCOLAX  Take 17 g by mouth daily.     SALONPAS EX  Apply 1 application topically daily as needed (for pain).        FOLLOW UP VISIT:       Follow-up Information   Follow up with Raymon Mutton, MD. Call on 08/28/2013.   Specialty:  Orthopedic Surgery   Contact information:   [address] [place] [address] [phone]       DISPOSITION: HOME  CONDITION:  Good   Taunia Frasco 08/16/2013, 10:44 AM

## 2013-08-20 ENCOUNTER — Encounter (HOSPITAL_COMMUNITY): Payer: Self-pay | Admitting: Orthopedic Surgery

## 2014-05-04 ENCOUNTER — Emergency Department (HOSPITAL_COMMUNITY)
Admission: EM | Admit: 2014-05-04 | Discharge: 2014-05-04 | Disposition: A | Discharge status: Home / Self Care | Payer: Medicare Other | Attending: Emergency Medicine | Admitting: Emergency Medicine

## 2014-05-04 ENCOUNTER — Emergency Department (HOSPITAL_COMMUNITY): Payer: Medicare Other

## 2014-05-04 ENCOUNTER — Encounter (HOSPITAL_COMMUNITY): Payer: Self-pay | Admitting: Emergency Medicine

## 2014-05-04 DIAGNOSIS — M129 Arthropathy, unspecified: Secondary | ICD-10-CM | POA: Diagnosis not present

## 2014-05-04 DIAGNOSIS — Z79899 Other long term (current) drug therapy: Secondary | ICD-10-CM | POA: Diagnosis not present

## 2014-05-04 DIAGNOSIS — Z90710 Acquired absence of both cervix and uterus: Secondary | ICD-10-CM | POA: Diagnosis not present

## 2014-05-04 DIAGNOSIS — Z8719 Personal history of other diseases of the digestive system: Secondary | ICD-10-CM | POA: Insufficient documentation

## 2014-05-04 DIAGNOSIS — H409 Unspecified glaucoma: Secondary | ICD-10-CM | POA: Diagnosis not present

## 2014-05-04 DIAGNOSIS — S301XXA Contusion of abdominal wall, initial encounter: Secondary | ICD-10-CM

## 2014-05-04 DIAGNOSIS — Y92410 Unspecified street and highway as the place of occurrence of the external cause: Secondary | ICD-10-CM | POA: Insufficient documentation

## 2014-05-04 DIAGNOSIS — S3981XA Other specified injuries of abdomen, initial encounter: Secondary | ICD-10-CM | POA: Insufficient documentation

## 2014-05-04 DIAGNOSIS — Y9389 Activity, other specified: Secondary | ICD-10-CM | POA: Insufficient documentation

## 2014-05-04 LAB — I-STAT CHEM 8, ED
BUN: 19 mg/dL (ref 6–23)
CALCIUM ION: 1.13 mmol/L (ref 1.13–1.30)
Chloride: 110 mEq/L (ref 96–112)
Creatinine, Ser: 0.9 mg/dL (ref 0.50–1.10)
GLUCOSE: 73 mg/dL (ref 70–99)
HCT: 42 % (ref 36.0–46.0)
HEMOGLOBIN: 14.3 g/dL (ref 12.0–15.0)
Potassium: 3.8 mEq/L (ref 3.7–5.3)
Sodium: 136 mEq/L — ABNORMAL LOW (ref 137–147)
TCO2: 22 mmol/L (ref 0–100)

## 2014-05-04 MED ORDER — IOHEXOL 300 MG/ML  SOLN
100.0000 mL | Freq: Once | INTRAMUSCULAR | Status: AC | PRN
  Administered 2014-05-04: 100 mL via INTRAVENOUS

## 2014-05-04 MED ORDER — SODIUM CHLORIDE 0.9 % IV BOLUS (SEPSIS)
500.0000 mL | Freq: Once | INTRAVENOUS | Status: AC
  Administered 2014-05-04: 500 mL via INTRAVENOUS
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
## 2014-05-04 NOTE — Discharge Instructions (Signed)
Ibuprofen 600 mg every 6 hours as needed for pain.  Return to the emergency department if you develop any new and concerning symptoms.   Blunt Abdominal Trauma A blunt injury to the abdomen can cause pain. The pain is most likely from bruising and stretching of your muscles. This pain is often made worse with movement. Most often these injuries are not serious and get better within 1 week with rest and mild pain medicine. However, internal organs (liver, spleen, kidneys) can be injured with blunt trauma. If you do not get better or if you get worse, further examination may be needed. Continue with your regular daily activities, but avoid any strenuous activities until your pain is improved. If your stomach is upset, stick to a clear liquid diet and slowly advance to solid food.  SEEK IMMEDIATE MEDICAL CARE IF:   You develop increasing pain, nausea, or repeated vomiting.  You develop chest pain or breathing difficulty.  You develop blood in the urine, vomit, or stool.  You develop weakness, fainting, fever, or other serious complaints. Document Released: 09/02/2004 Document Revised: 10/18/2011 Document Reviewed: 12/19/2008 [facility] Patient Information 2015 [place], [place]. This information is not intended to replace advice given to you by your health care provider. Make sure you discuss any questions you have with your health care provider.

## 2014-05-04 NOTE — ED Notes (Signed)
MD at bedside. 
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
## 2020-08-09 HISTORY — PX: CATARACT EXTRACTION: SUR2

## 2021-05-26 ENCOUNTER — Ambulatory Visit (INDEPENDENT_AMBULATORY_CARE_PROVIDER_SITE_OTHER): Payer: Medicare Other | Admitting: Cardiovascular Disease

## 2021-05-26 ENCOUNTER — Encounter: Payer: Self-pay | Admitting: Cardiovascular Disease

## 2021-05-26 ENCOUNTER — Other Ambulatory Visit: Payer: Self-pay

## 2021-05-26 VITALS — BP 119/73 | HR 63 | Ht 62.0 in | Wt 180.8 lb

## 2021-05-26 DIAGNOSIS — I350 Nonrheumatic aortic (valve) stenosis: Secondary | ICD-10-CM | POA: Diagnosis not present

## 2021-05-26 DIAGNOSIS — R0989 Other specified symptoms and signs involving the circulatory and respiratory systems: Secondary | ICD-10-CM

## 2021-05-26 NOTE — Patient Instructions (Signed)
Medication Instructions:  Your physician recommends that you continue on your current medications as directed. Please refer to the Current Medication list given to you today.  *If you need a refill on your cardiac medications before your next appointment, please call your pharmacy*   Testing/Procedures: Your physician has requested that you have an echocardiogram. Echocardiography is a painless test that uses sound waves to create images of your heart. It provides your doctor with information about the size and shape of your heart and how well your heart's chambers and valves are working. This procedure takes approximately one hour. There are no restrictions for this procedure. This procedure is done at [address].  Your physician has requested that you have a carotid duplex. This test is an ultrasound of the carotid arteries in your neck. It looks at blood flow through these arteries that supply the brain with blood. Allow one hour for this exam. There are no restrictions or special instructions. This procedure is done at [address] [facility].    Follow-Up: At [facility], you and your health needs are our priority.  As part of our continuing mission to provide you with exceptional heart care, we have created designated Provider Care Teams.  These Care Teams include your primary Cardiologist (physician) and Advanced Practice Providers (APPs -  Physician Assistants and Nurse Practitioners) who all work together to provide you with the care you need, when you need it.  We recommend signing up for the patient portal called "MyChart".  Sign up information is provided on this After Visit Summary.  MyChart is used to connect with patients for Virtual Visits (Telemedicine).  Patients are able to view lab/test results, encounter notes, upcoming appointments, etc.  Non-urgent messages can be sent to your provider as well.   To learn more about what you can do with MyChart, go to  [email or web address].    Your next appointment:   12 month(s)  The format for your next appointment:   In Person  Provider:   Nanetta Batty, MD

## 2021-05-26 NOTE — Assessment & Plan Note (Signed)
[name] was referred by Dr. Azucena Cecil for evaluation of severe aortic stenosis.  She apparently had a murmur recently auscultated and had a 2D echo performed by her PCP that showed severe aortic stenosis.  She denies chest pain patient does have some mild dyspnea when walking up 3 flights of stairs.  We talked about the symptoms of severe aortic stenosis and about the treatment options.  We will continue to monitor noninvasively.

## 2021-05-26 NOTE — Progress Notes (Signed)
05/26/2021 SYDNY SCHNITZLER   15-Nov-1945  [id number]  Primary Physician Elias Else, MD Primary Cardiologist: Runell Gess MD Nicholes Calamity, [place]  HPI:  [name] is a 75 y.o. mildly overweight married Caucasian female mother of one 10 year old son, grandmother of 2 grandchildren who is accompanied by her husband [name] today.  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  She is referred by her PCP, Dr. Tally Joe, for evaluation of severe aortic stenosis.  She apparently was in a RSV trial and had one of the physicians auscultate a murmur.  This was confirmed by her PCP who ordered a 2D echo at their practice which was notable for severe aortic stenosis.  She has minimal symptoms except for mild dyspnea when she walks up 2 sets of stairs in her 3 level townhome.  She denies chest pain or dizziness.  She has no other cardiac risk factors.  She is never had a heart attack or stroke.  There is no family history for heart disease.   Current Meds  Medication Sig   acetaminophen (TYLENOL) 500 MG tablet Take 500 mg by mouth every 6 (six) hours as needed for moderate pain.   brimonidine (ALPHAGAN P) 0.1 % SOLN Place 1 drop into the left eye every 12 (twelve) hours.   Calcium Carb-Cholecalciferol (CALCIUM 600 + D PO) Take 1 tablet by mouth 2 (two) times daily.   clindamycin (CLEOCIN) 300 MG capsule Take by mouth.   docusate sodium (COLACE) 100 MG capsule Take 200 mg by mouth at bedtime.   dorzolamide-timolol (COSOPT) 22.3-6.8 MG/ML ophthalmic solution Place 1 drop into both eyes every 12 (twelve) hours.   famotidine (PEPCID) 20 MG tablet Take 20 mg by mouth daily as needed for indigestion.   Flaxseed, Linseed, (FLAX SEED OIL) 1000 MG CAPS Take 1,000 mg by mouth daily.   Glucosamine-Chondroit-Vit C-Mn (GLUCOSAMINE 1500 COMPLEX PO) Take 2 tablets by mouth 2 (two) times daily.   Liniments (SALONPAS EX) Apply 1 application topically daily as needed (for pain).    meclizine (ANTIVERT) 25 MG tablet Take 25 mg by mouth 3 (three) times daily as needed (for motion sickness).   Multiple Vitamin (MULTIVITAMIN WITH MINERALS) TABS tablet Take 1 tablet by mouth daily.   polyethylene glycol (MIRALAX / GLYCOLAX) packet Take 17 g by mouth daily.   Psyllium (METAMUCIL PO) Take 2 capsules by mouth 2 (two) times daily.   raloxifene (EVISTA) 60 MG tablet Take 60 mg by mouth daily.   SODIUM FLUORIDE 5000 PPM 1.1 % PSTE Take by mouth daily.     Allergies  Allergen Reactions   Codeine Nausea Only   Keflex [Cephalexin] Rash    Social History   Socioeconomic History   Marital status: Married    Spouse name: Not on file   Number of children: Not on file   Years of education: Not on file   Highest education level: Not on file  Occupational History   Not on file  Tobacco Use   Smoking status: Never   Smokeless tobacco: Never  Substance and Sexual Activity   Alcohol use: No   Drug use: No   Sexual activity: Not on file  Other Topics Concern   Not on file  Social History Narrative   Not on file   Social Determinants of Health   Financial Resource Strain: Not on file  Food Insecurity: Not on file  Transportation Needs: Not on file  Physical Activity: Not on file  Stress: Not on file  Social Connections: Not on file  Intimate Partner Violence: Not on file     Review of Systems: General: negative for chills, fever, night sweats or weight changes.  Cardiovascular: negative for chest pain, dyspnea on exertion, edema, orthopnea, palpitations, paroxysmal nocturnal dyspnea or shortness of breath Dermatological: negative for rash Respiratory: negative for cough or wheezing Urologic: negative for hematuria Abdominal: negative for nausea, vomiting, diarrhea, bright red blood per rectum, melena, or hematemesis Neurologic: negative for visual changes, syncope, or dizziness All other systems reviewed and are otherwise negative except as noted  above.    Blood pressure 119/73, pulse 63, height 5\' 2"  (1.575 m), weight 180 lb 12.8 oz (82 kg), SpO2 98 %.  General appearance: alert and no distress Neck: no adenopathy, no JVD, supple, symmetrical, trachea midline, thyroid not enlarged, symmetric, no tenderness/mass/nodules, and bilateral carotid bruits versus transmitted murmur Lungs: clear to auscultation bilaterally Heart: 2/6 systolic ejection murmur at the base consistent with aortic stenosis. Extremities: extremities normal, atraumatic, no cyanosis or edema Pulses: 2+ and symmetric Skin: Skin color, texture, turgor normal. No rashes or lesions Neurologic: Grossly normal  EKG sinus rhythm at 63 without ST or T wave changes.  I personally reviewed this EKG.  ASSESSMENT AND PLAN:   Severe aortic stenosis Ms. Dadisman was referred by Dr. Effie Shy for evaluation of severe aortic stenosis.  She apparently had a murmur recently auscultated and had a 2D echo performed by her PCP that showed severe aortic stenosis.  She denies chest pain patient does have some mild dyspnea when walking up 3 flights of stairs.  We talked about the symptoms of severe aortic stenosis and about the treatment options.  We will continue to monitor noninvasively.     Azucena Cecil MD FACP,FACC,FAHA, [facility] 05/26/2021 9:55 AM

## 2021-06-10 ENCOUNTER — Ambulatory Visit (HOSPITAL_COMMUNITY)
Admission: RE | Admit: 2021-06-10 | Discharge: 2021-06-10 | Disposition: A | Discharge status: Home / Self Care | Payer: Medicare Other | Source: Ambulatory Visit | Attending: Cardiology | Admitting: Cardiology

## 2021-06-10 ENCOUNTER — Other Ambulatory Visit: Payer: Self-pay

## 2021-06-10 DIAGNOSIS — I350 Nonrheumatic aortic (valve) stenosis: Secondary | ICD-10-CM | POA: Insufficient documentation

## 2021-06-10 DIAGNOSIS — R0989 Other specified symptoms and signs involving the circulatory and respiratory systems: Secondary | ICD-10-CM | POA: Diagnosis not present

## 2021-06-23 ENCOUNTER — Ambulatory Visit (HOSPITAL_COMMUNITY): Payer: Medicare Other | Attending: Cardiovascular Disease

## 2021-06-23 ENCOUNTER — Other Ambulatory Visit: Payer: Self-pay

## 2021-06-23 DIAGNOSIS — I350 Nonrheumatic aortic (valve) stenosis: Secondary | ICD-10-CM | POA: Diagnosis not present

## 2021-06-23 LAB — ECHOCARDIOGRAM COMPLETE
AR max vel: 0.83 cm2
AV Area VTI: 0.79 cm2
AV Area mean vel: 0.86 cm2
AV Mean grad: 46.9 mmHg
AV Peak grad: 84.8 mmHg
Ao pk vel: 4.61 m/s
Area-P 1/2: 2.8 cm2
P 1/2 time: 487 msec
S' Lateral: 3 cm

## 2021-10-05 ENCOUNTER — Encounter: Payer: Self-pay | Admitting: Cardiovascular Disease

## 2021-10-08 ENCOUNTER — Ambulatory Visit (INDEPENDENT_AMBULATORY_CARE_PROVIDER_SITE_OTHER): Payer: Medicare Other | Admitting: Cardiovascular Disease

## 2021-10-08 ENCOUNTER — Encounter: Payer: Self-pay | Admitting: Cardiovascular Disease

## 2021-10-08 ENCOUNTER — Other Ambulatory Visit: Payer: Self-pay

## 2021-10-08 DIAGNOSIS — I350 Nonrheumatic aortic (valve) stenosis: Secondary | ICD-10-CM | POA: Diagnosis not present

## 2021-10-08 NOTE — Assessment & Plan Note (Signed)
Ms. Hulsey returns today to discuss her symptoms related to her severe aortic stenosis.  She has progressive dyspnea with less activity but denies chest pain.  I think we are at the point where I feel it is appropriate to do an invasive procedure (right and left heart cath) to define her anatomy and physiology and consider aortic valve replacement (SAVR/T AVR).  I will arrange to do this this coming Monday (radial/brachial). ?

## 2021-10-08 NOTE — Patient Instructions (Signed)
Medication Instructions:  ?The current medical regimen is effective;  continue present plan and medications. ? ?*If you need a refill on your cardiac medications before your next appointment, please call your pharmacy* ? ? ?Lab Work: ?BMET, CBC today  ? ?If you have labs (blood work) drawn today and your tests are completely normal, you will receive your results only by: ?MyChart Message (if you have MyChart) OR ?A paper copy in the mail ?If you have any lab test that is abnormal or we need to change your treatment, we will call you to review the results. ? ? ?Testing/Procedures: ?Your physician has requested that you have a cardiac catheterization. Cardiac catheterization is used to diagnose and/or treat various heart conditions. Doctors may recommend this procedure for a number of different reasons. The most common reason is to evaluate chest pain. Chest pain can be a symptom of coronary artery disease (CAD), and cardiac catheterization can show whether plaque is narrowing or blocking your heart?s arteries. This procedure is also used to evaluate the valves, as well as measure the blood flow and oxygen levels in different parts of your heart. For further information please visit [email or web address] Please follow instruction sheet, as given. ? ? ?Follow-Up: ?At [facility], you and your health needs are our priority.  As part of our continuing mission to provide you with exceptional heart care, we have created designated Provider Care Teams.  These Care Teams include your primary Cardiologist (physician) and Advanced Practice Providers (APPs -  Physician Assistants and Nurse Practitioners) who all work together to provide you with the care you need, when you need it. ? ?We recommend signing up for the patient portal called "MyChart".  Sign up information is provided on this After Visit Summary.  MyChart is used to connect with patients for Virtual Visits (Telemedicine).  Patients are able to view lab/test  results, encounter notes, upcoming appointments, etc.  Non-urgent messages can be sent to your provider as well.   ?To learn more about what you can do with MyChart, go to [email or web address].   ? ?Your next appointment:   ?2 week(s) ? ?The format for your next appointment:   ?In Person ? ?Provider:   ?Nanetta Batty, MD  ? ? ?Other Instructions ? ?[facility] ?[facility] ?[address] ?[place] [address] ?Dept: [phone] ?Loc: [phone] ? ?KEISHIA GROUND  10/08/2021 ? ?You are scheduled for a Cardiac Catheterization on Monday, March 6 with Dr. Nanetta Batty. ? ?1. Please arrive at the Main Entrance A at [facility]: [address] [place], [place] [address] at 6:30 AM (This time is two hours before your procedure to ensure your preparation). Free valet parking service is available.  ? ?Special note: Every effort is made to have your procedure done on time. Please understand that emergencies sometimes delay scheduled procedures. ? ?2. Diet: Do not eat solid foods after midnight.  You may have clear liquids until 5 AM upon the day of the procedure. ? ?3. Labs: You will need to have blood drawn today- CBC, BMET. You do not need to be fasting. ? ?4. Medication instructions in preparation for your procedure: ? ? Contrast Allergy: No ? ?On the morning of your procedure, take Aspirin and any morning medicines NOT listed above.  You may use sips of water. ? ?5. Plan to go home the same day, you will only stay overnight if medically necessary. ?6. You MUST have a responsible adult  to drive you home. ?7. An adult MUST be with you the first 24 hours after you arrive home. ?8. Bring a current list of your medications, and the last time and date medication taken. ?9. Bring ID and current insurance cards. ?10.Please wear clothes that are easy to get on and off and wear slip-on shoes. ? ?Thank you for allowing [place] to care for you! ?  --  [facility] ? ? ?

## 2021-10-08 NOTE — H&P (View-Only) (Signed)
? ? ? ?10/08/2021 ?[name]   ?Feb 13, 1946  ?[id number] ? ?Primary Physician Ruth Dus, MD ?Primary Cardiologist: Ruth Harp MD [name], [place] ? ?HPI:  [name] is a 76 y.o.  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  She is referred by her PCP, Dr. Antony [name], for evaluation of severe aortic stenosis.  She apparently was in a RSV trial and had one of the physicians auscultate a murmur.  This was confirmed by her PCP who ordered a 2D echo at their practice which was notable for severe aortic stenosis.  She has minimal symptoms except for mild dyspnea when she walks up 2 sets of stairs in her [place] level townhome.  She denies chest pain or dizziness.  She has no other cardiac risk factors.  She is never had a heart attack or stroke.  There is no family history for heart disease. ? ?Since I saw her 5 months ago she has noticed increasing dyspnea with less exertion as well as more fatigue.  She denies chest pain.  We discussed performing right left heart cath to define her anatomy and physiology in anticipation of aortic valve replacement. ? ? ?Current Meds  ?Medication Sig  ? Calcium Carb-Cholecalciferol (CALCIUM 600 + D PO) Take 1 tablet by mouth 2 (two) times daily.  ? docusate sodium (COLACE) 100 MG capsule Take 200 mg by mouth at bedtime.  ? dorzolamide-timolol (COSOPT) 22.3-6.8 MG/ML ophthalmic solution Place 1 drop into both eyes every 12 (twelve) hours.  ? Flaxseed, Linseed, (FLAX SEED OIL) 1000 MG CAPS Take 1,000 mg by mouth daily.  ? Glucosamine-Chondroit-Vit C-Mn (GLUCOSAMINE 1500 COMPLEX PO) Take 2 tablets by mouth 2 (two) times daily.  ? latanoprost (XALATAN) 0.005 % ophthalmic solution Place 1 drop into both eyes at bedtime.  ? Liniments (SALONPAS EX) Apply 1 application topically daily as needed (for pain).   ? meclizine (ANTIVERT) 25 MG tablet Take 25 mg by mouth 3 (three) times daily as needed (for motion sickness).  ? Multiple Vitamin (MULTIVITAMIN WITH MINERALS) TABS tablet Take 1 tablet by mouth daily.  ? polyethylene glycol (MIRALAX / GLYCOLAX) packet Take 17 g by mouth daily.  ? Psyllium (METAMUCIL PO) Take 2 capsules by mouth 2 (two) times daily.  ? raloxifene (EVISTA) 60 MG tablet Take 60 mg by mouth daily.  ? SODIUM FLUORIDE 5000 PPM 1.1 % PSTE Take by mouth daily.  ?  ? ?Allergies  ?Allergen Reactions  ? Codeine Nausea Only  ? Keflex [Cephalexin] Rash  ? ? ?Social History  ? ?Socioeconomic History  ? Marital status: Married  ?  Spouse name: Not on file  ? Number of children: Not on file  ? Years of education: Not on file  ? Highest education level: Not on file  ?Occupational History  ? Not on file  ?Tobacco Use  ? Smoking status: Never  ? Smokeless tobacco: Never  ?Substance and Sexual Activity  ? Alcohol use: No  ? Drug use: No  ? Sexual activity: Not on file  ?Other Topics Concern  ? Not on file  ?Social History Narrative  ? Not on file  ? ?Social Determinants of Health  ? ?Financial Resource Strain: Not on file  ?Food Insecurity: Not on file  ?Transportation Needs: Not on file  ?Physical Activity: Not  on file  ?Stress: Not on file  ?Social Connections: Not on file  ?Intimate Partner Violence: Not on file  ?  ? ?Review of Systems: ?General: negative for chills, fever, night sweats or weight changes.  ?Cardiovascular: negative for chest pain, dyspnea on exertion, edema, orthopnea, palpitations, paroxysmal nocturnal dyspnea or shortness of breath ?Dermatological: negative for rash ?Respiratory: negative for cough or wheezing ?Urologic: negative for hematuria ?Abdominal: negative for nausea, vomiting, diarrhea, bright red blood per rectum, melena, or hematemesis ?Neurologic: negative for visual changes, syncope, or dizziness ?All other systems reviewed and are otherwise negative except as noted  above. ? ? ? ?Blood pressure 130/80, pulse 73, height 5\' 2"  (1.575 m), weight 184 lb 12.8 oz (83.8 kg), SpO2 99 %.  ?General appearance: alert and no distress ?Neck: no adenopathy, no JVD, supple, symmetrical, trachea midline, thyroid not enlarged, symmetric, no tenderness/mass/nodules, and bilateral carotid bruits versus transmitted murmur ?Lungs: clear to auscultation bilaterally ?Heart: 2/6 outflow tract murmur consistent with aortic stenosis ?Extremities: extremities normal, atraumatic, no cyanosis or edema ?Pulses: 2+ and symmetric ?Skin: Skin color, texture, turgor normal. No rashes or lesions ?Neurologic: Grossly normal ? ?EKG sinus rhythm at 73 with nonspecific ST and T wave changes.  Personally reviewed this EKG. ? ?ASSESSMENT AND PLAN:  ? ?Severe aortic stenosis ?Ms. [name] returns today to discuss her symptoms related to her severe aortic stenosis.  She has progressive dyspnea with less activity but denies chest pain.  I think we are at the point where I feel it is appropriate to do an invasive procedure (right and left heart cath) to define her anatomy and physiology and consider aortic valve replacement (SAVR/T AVR).  I will arrange to do this this coming Monday (radial/brachial). ? ? ? ? ?[name],[name],[name], [name] ?10/08/2021 ?1:30 PM ?

## 2021-10-08 NOTE — Progress Notes (Signed)
? ? ? ?10/08/2021 ?[name]   ?08/07/1946  ?[id number] ? ?Primary Physician Reade, Robert, MD ?Primary Cardiologist: Shaine Mount J Jasten Guyette MD FACP, FACC, FAHA, FSCAI ? ?HPI:  [name] is a 75 y.o.  mildly overweight married Caucasian female mother of one 44-year-old son, grandmother of 2 grandchildren who I last saw in the office 05/26/2021..  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  She is referred by her PCP, Dr. David Swayne, for evaluation of severe aortic stenosis.  She apparently was in a RSV trial and had one of the physicians auscultate a murmur.  This was confirmed by her PCP who ordered a 2D echo at their practice which was notable for severe aortic stenosis.  She has minimal symptoms except for mild dyspnea when she walks up 2 sets of stairs in her [place] level townhome.  She denies chest pain or dizziness.  She has no other cardiac risk factors.  She is never had a heart attack or stroke.  There is no family history for heart disease. ? ?Since I saw her 5 months ago she has noticed increasing dyspnea with less exertion as well as more fatigue.  She denies chest pain.  We discussed performing right left heart cath to define her anatomy and physiology in anticipation of aortic valve replacement. ? ? ?Current Meds  ?Medication Sig  ? Calcium Carb-Cholecalciferol (CALCIUM 600 + D PO) Take 1 tablet by mouth 2 (two) times daily.  ? docusate sodium (COLACE) 100 MG capsule Take 200 mg by mouth at bedtime.  ? dorzolamide-timolol (COSOPT) 22.3-6.8 MG/ML ophthalmic solution Place 1 drop into both eyes every 12 (twelve) hours.  ? Flaxseed, Linseed, (FLAX SEED OIL) 1000 MG CAPS Take 1,000 mg by mouth daily.  ? Glucosamine-Chondroit-Vit C-Mn (GLUCOSAMINE 1500 COMPLEX PO) Take 2 tablets by mouth 2 (two) times daily.  ? latanoprost (XALATAN) 0.005 % ophthalmic solution Place 1 drop into both eyes at bedtime.  ? Liniments (SALONPAS EX) Apply 1 application topically daily as needed (for pain).   ? meclizine (ANTIVERT) 25 MG tablet Take 25 mg by mouth 3 (three) times daily as needed (for motion sickness).  ? Multiple Vitamin (MULTIVITAMIN WITH MINERALS) TABS tablet Take 1 tablet by mouth daily.  ? polyethylene glycol (MIRALAX / GLYCOLAX) packet Take 17 g by mouth daily.  ? Psyllium (METAMUCIL PO) Take 2 capsules by mouth 2 (two) times daily.  ? raloxifene (EVISTA) 60 MG tablet Take 60 mg by mouth daily.  ? SODIUM FLUORIDE 5000 PPM 1.1 % PSTE Take by mouth daily.  ?  ? ?Allergies  ?Allergen Reactions  ? Codeine Nausea Only  ? Keflex [Cephalexin] Rash  ? ? ?Social History  ? ?Socioeconomic History  ? Marital status: Married  ?  Spouse name: Not on file  ? Number of children: Not on file  ? Years of education: Not on file  ? Highest education level: Not on file  ?Occupational History  ? Not on file  ?Tobacco Use  ? Smoking status: Never  ? Smokeless tobacco: Never  ?Substance and Sexual Activity  ? Alcohol use: No  ? Drug use: No  ? Sexual activity: Not on file  ?Other Topics Concern  ? Not on file  ?Social History Narrative  ? Not on file  ? ?Social Determinants of Health  ? ?Financial Resource Strain: Not on file  ?Food Insecurity: Not on file  ?Transportation Needs: Not on file  ?Physical Activity: Not   on file  ?Stress: Not on file  ?Social Connections: Not on file  ?Intimate Partner Violence: Not on file  ?  ? ?Review of Systems: ?General: negative for chills, fever, night sweats or weight changes.  ?Cardiovascular: negative for chest pain, dyspnea on exertion, edema, orthopnea, palpitations, paroxysmal nocturnal dyspnea or shortness of breath ?Dermatological: negative for rash ?Respiratory: negative for cough or wheezing ?Urologic: negative for hematuria ?Abdominal: negative for nausea, vomiting, diarrhea, bright red blood per rectum, melena, or hematemesis ?Neurologic: negative for visual changes, syncope, or dizziness ?All other systems reviewed and are otherwise negative except as noted  above. ? ? ? ?Blood pressure 130/80, pulse 73, height 5' 2" (1.575 m), weight 184 lb 12.8 oz (83.8 kg), SpO2 99 %.  ?General appearance: alert and no distress ?Neck: no adenopathy, no JVD, supple, symmetrical, trachea midline, thyroid not enlarged, symmetric, no tenderness/mass/nodules, and bilateral carotid bruits versus transmitted murmur ?Lungs: clear to auscultation bilaterally ?Heart: 2/6 outflow tract murmur consistent with aortic stenosis ?Extremities: extremities normal, atraumatic, no cyanosis or edema ?Pulses: 2+ and symmetric ?Skin: Skin color, texture, turgor normal. No rashes or lesions ?Neurologic: Grossly normal ? ?EKG sinus rhythm at 73 with nonspecific ST and T wave changes.  Personally reviewed this EKG. ? ?ASSESSMENT AND PLAN:  ? ?Severe aortic stenosis ?[name] returns today to discuss her symptoms related to her severe aortic stenosis.  She has progressive dyspnea with less activity but denies chest pain.  I think we are at the point where I feel it is appropriate to do an invasive procedure (right and left heart cath) to define her anatomy and physiology and consider aortic valve replacement (SAVR/T AVR).  I will arrange to do this this coming Monday (radial/brachial). ? ? ? ? ?Ruth Knierim J. Kaityln Kallstrom MD FACP,FACC,FAHA, FSCAI ?10/08/2021 ?1:30 PM ?

## 2021-10-09 LAB — CBC
Hematocrit: 38.6 % (ref 34.0–46.6)
Hemoglobin: 12.9 g/dL (ref 11.1–15.9)
MCH: 30.8 pg (ref 26.6–33.0)
MCHC: 33.4 g/dL (ref 31.5–35.7)
MCV: 92 fL (ref 79–97)
Platelets: 229 10*3/uL (ref 150–450)
RBC: 4.19 x10E6/uL (ref 3.77–5.28)
RDW: 12.5 % (ref 11.7–15.4)
WBC: 4.9 10*3/uL (ref 3.4–10.8)

## 2021-10-09 LAB — BASIC METABOLIC PANEL
BUN/Creatinine Ratio: 29 — ABNORMAL HIGH (ref 12–28)
BUN: 25 mg/dL (ref 8–27)
CO2: 25 mmol/L (ref 20–29)
Calcium: 9.5 mg/dL (ref 8.7–10.3)
Chloride: 105 mmol/L (ref 96–106)
Creatinine, Ser: 0.86 mg/dL (ref 0.57–1.00)
Glucose: 105 mg/dL — ABNORMAL HIGH (ref 70–99)
Potassium: 5.2 mmol/L (ref 3.5–5.2)
Sodium: 140 mmol/L (ref 134–144)
eGFR: 71 mL/min/{1.73_m2} (ref >59)

## 2021-10-12 ENCOUNTER — Other Ambulatory Visit: Payer: Self-pay

## 2021-10-12 ENCOUNTER — Encounter (HOSPITAL_COMMUNITY)
Admission: RE | Disposition: A | Discharge status: Home / Self Care | Payer: Self-pay | Source: Home / Self Care | Attending: Cardiovascular Disease

## 2021-10-12 ENCOUNTER — Ambulatory Visit (HOSPITAL_COMMUNITY)
Admission: RE | Admit: 2021-10-12 | Discharge: 2021-10-12 | Disposition: A | Discharge status: Home / Self Care | Payer: Medicare Other | Attending: Cardiovascular Disease | Admitting: Cardiovascular Disease

## 2021-10-12 DIAGNOSIS — I350 Nonrheumatic aortic (valve) stenosis: Secondary | ICD-10-CM | POA: Insufficient documentation

## 2021-10-12 DIAGNOSIS — R0609 Other forms of dyspnea: Secondary | ICD-10-CM | POA: Diagnosis not present

## 2021-10-12 HISTORY — PX: RIGHT/LEFT HEART CATH AND CORONARY ANGIOGRAPHY: CATH118266

## 2021-10-12 LAB — POCT I-STAT 7, (LYTES, BLD GAS, ICA,H+H)
Acid-base deficit: 2 mmol/L (ref 0.0–2.0)
Bicarbonate: 23 mmol/L (ref 20.0–28.0)
Calcium, Ion: 1.23 mmol/L (ref 1.15–1.40)
HCT: 35 % — ABNORMAL LOW (ref 36.0–46.0)
Hemoglobin: 11.9 g/dL — ABNORMAL LOW (ref 12.0–15.0)
O2 Saturation: 96 %
Potassium: 3.8 mmol/L (ref 3.5–5.1)
Sodium: 141 mmol/L (ref 135–145)
TCO2: 24 mmol/L (ref 22–32)
pCO2 arterial: 38.6 mmHg (ref 32–48)
pH, Arterial: 7.383 (ref 7.35–7.45)
pO2, Arterial: 82 mmHg — ABNORMAL LOW (ref 83–108)

## 2021-10-12 LAB — POCT I-STAT EG7
Acid-base deficit: 1 mmol/L (ref 0.0–2.0)
Acid-base deficit: 2 mmol/L (ref 0.0–2.0)
Bicarbonate: 23.7 mmol/L (ref 20.0–28.0)
Bicarbonate: 24.3 mmol/L (ref 20.0–28.0)
Calcium, Ion: 1.21 mmol/L (ref 1.15–1.40)
Calcium, Ion: 1.25 mmol/L (ref 1.15–1.40)
HCT: 35 % — ABNORMAL LOW (ref 36.0–46.0)
HCT: 36 % (ref 36.0–46.0)
Hemoglobin: 11.9 g/dL — ABNORMAL LOW (ref 12.0–15.0)
Hemoglobin: 12.2 g/dL (ref 12.0–15.0)
O2 Saturation: 71 %
O2 Saturation: 73 %
Potassium: 3.7 mmol/L (ref 3.5–5.1)
Potassium: 3.9 mmol/L (ref 3.5–5.1)
Sodium: 141 mmol/L (ref 135–145)
Sodium: 141 mmol/L (ref 135–145)
TCO2: 25 mmol/L (ref 22–32)
TCO2: 26 mmol/L (ref 22–32)
pCO2, Ven: 41.9 mmHg — ABNORMAL LOW (ref 44–60)
pCO2, Ven: 43 mmHg — ABNORMAL LOW (ref 44–60)
pH, Ven: 7.36 (ref 7.25–7.43)
pH, Ven: 7.361 (ref 7.25–7.43)
pO2, Ven: 39 mmHg (ref 32–45)
pO2, Ven: 40 mmHg (ref 32–45)

## 2021-10-12 SURGERY — RIGHT/LEFT HEART CATH AND CORONARY ANGIOGRAPHY
Anesthesia: LOCAL

## 2021-10-12 MED ORDER — SODIUM CHLORIDE 0.9 % IV SOLN: INTRAVENOUS | Status: DC

## 2021-10-12 MED ORDER — SODIUM CHLORIDE 0.9 % WEIGHT BASED INFUSION: 3.0000 mL/kg/h | INTRAVENOUS | Status: AC

## 2021-10-12 MED ORDER — FENTANYL CITRATE (PF) 100 MCG/2ML IJ SOLN
INTRAMUSCULAR | Status: DC | PRN
  Administered 2021-10-12: 25 ug via INTRAVENOUS

## 2021-10-12 MED ORDER — SODIUM CHLORIDE 0.9% FLUSH: 3.0000 mL | Freq: Two times a day (BID) | INTRAVENOUS | Status: DC

## 2021-10-12 MED ORDER — VERAPAMIL HCL 2.5 MG/ML IV SOLN
INTRA_ARTERIAL | Status: DC | PRN
  Administered 2021-10-12: 10 mL via INTRA_ARTERIAL

## 2021-10-12 MED ORDER — HYDRALAZINE HCL 20 MG/ML IJ SOLN: 10.0000 mg | INTRAMUSCULAR | Status: DC | PRN

## 2021-10-12 MED ORDER — SODIUM CHLORIDE 0.9% FLUSH: 3.0000 mL | INTRAVENOUS | Status: DC | PRN

## 2021-10-12 MED ORDER — HEPARIN SODIUM (PORCINE) 1000 UNIT/ML IJ SOLN
INTRAMUSCULAR | Status: AC
  Filled 2021-10-12: qty 10

## 2021-10-12 MED ORDER — HEPARIN SODIUM (PORCINE) 1000 UNIT/ML IJ SOLN
INTRAMUSCULAR | Status: DC | PRN
  Administered 2021-10-12: 4000 [IU] via INTRAVENOUS

## 2021-10-12 MED ORDER — FENTANYL CITRATE (PF) 100 MCG/2ML IJ SOLN
INTRAMUSCULAR | Status: AC
  Filled 2021-10-12: qty 2

## 2021-10-12 MED ORDER — SODIUM CHLORIDE 0.9 % IV SOLN: 250.0000 mL | INTRAVENOUS | Status: DC | PRN

## 2021-10-12 MED ORDER — LIDOCAINE HCL (PF) 1 % IJ SOLN
INTRAMUSCULAR | Status: AC
  Filled 2021-10-12: qty 30

## 2021-10-12 MED ORDER — NITROGLYCERIN 1 MG/10 ML FOR IR/CATH LAB
INTRA_ARTERIAL | Status: AC
  Filled 2021-10-12: qty 10

## 2021-10-12 MED ORDER — ASPIRIN 81 MG PO CHEW: 81.0000 mg | CHEWABLE_TABLET | ORAL | Status: DC

## 2021-10-12 MED ORDER — HEPARIN (PORCINE) IN NACL 1000-0.9 UT/500ML-% IV SOLN
INTRAVENOUS | Status: AC
  Filled 2021-10-12: qty 500

## 2021-10-12 MED ORDER — MIDAZOLAM HCL 2 MG/2ML IJ SOLN
INTRAMUSCULAR | Status: DC | PRN
  Administered 2021-10-12: 1 mg via INTRAVENOUS

## 2021-10-12 MED ORDER — IOHEXOL 350 MG/ML SOLN
INTRAVENOUS | Status: DC | PRN
  Administered 2021-10-12: 45 mL

## 2021-10-12 MED ORDER — MIDAZOLAM HCL 2 MG/2ML IJ SOLN
INTRAMUSCULAR | Status: AC
  Filled 2021-10-12: qty 2

## 2021-10-12 MED ORDER — ONDANSETRON HCL 4 MG/2ML IJ SOLN: 4.0000 mg | Freq: Four times a day (QID) | INTRAMUSCULAR | Status: DC | PRN

## 2021-10-12 MED ORDER — VERAPAMIL HCL 2.5 MG/ML IV SOLN
INTRAVENOUS | Status: AC
  Filled 2021-10-12: qty 2

## 2021-10-12 MED ORDER — ACETAMINOPHEN 325 MG PO TABS: 650.0000 mg | ORAL_TABLET | ORAL | Status: DC | PRN

## 2021-10-12 MED ORDER — HEPARIN (PORCINE) IN NACL 1000-0.9 UT/500ML-% IV SOLN
INTRAVENOUS | Status: DC | PRN
  Administered 2021-10-12 (×2): 500 mL

## 2021-10-12 MED ORDER — LIDOCAINE HCL (PF) 1 % IJ SOLN
INTRAMUSCULAR | Status: DC | PRN
  Administered 2021-10-12: 2 mL
  Administered 2021-10-12: 5 mL

## 2021-10-12 MED ORDER — SODIUM CHLORIDE 0.9 % WEIGHT BASED INFUSION: 1.0000 mL/kg/h | INTRAVENOUS | Status: DC

## 2021-10-12 MED ORDER — LABETALOL HCL 5 MG/ML IV SOLN: 10.0000 mg | INTRAVENOUS | Status: DC | PRN

## 2021-10-12 SURGICAL SUPPLY — 14 items
CATH BALLN WEDGE 5F 110CM (CATHETERS) ×1 IMPLANT
CATH INFINITI JR4 5F (CATHETERS) ×1 IMPLANT
CATH OPTITORQUE TIG 4.0 5F (CATHETERS) ×1 IMPLANT
DEVICE RAD COMP TR BAND LRG (VASCULAR PRODUCTS) ×1 IMPLANT
GLIDESHEATH SLEND A-KIT 6F 22G (SHEATH) ×1 IMPLANT
GUIDEWIRE INQWIRE 1.5J.035X260 (WIRE) IMPLANT
INQWIRE 1.5J .035X260CM (WIRE) ×2
KIT HEART LEFT (KITS) ×2 IMPLANT
PACK CARDIAC CATHETERIZATION (CUSTOM PROCEDURE TRAY) ×2 IMPLANT
SHEATH GLIDE SLENDER 4/5FR (SHEATH) ×1 IMPLANT
TRANSDUCER W/STOPCOCK (MISCELLANEOUS) ×2 IMPLANT
TUBING CIL FLEX 10 FLL-RA (TUBING) ×2 IMPLANT
WIRE EMERALD ST .035X150CM (WIRE) ×1 IMPLANT
WIRE HI TORQ VERSACORE-J 145CM (WIRE) ×1 IMPLANT

## 2021-10-12 NOTE — Interval H&P Note (Signed)
Cath Lab Visit (complete for each Cath Lab visit) ? ?Clinical Evaluation Leading to the Procedure:  ? ?ACS: No. ? ?Non-ACS:   ? ?Anginal Classification: No Symptoms ? ?Anti-ischemic medical therapy: No Therapy ? ?Non-Invasive Test Results: No non-invasive testing performed ? ?Prior CABG: No previous CABG ? ? ? ? ? ?History and Physical Interval Note: ? ?10/12/2021 ?7:32 AM ? ?[name]  has presented today for surgery, with the diagnosis of severe as.  The various methods of treatment have been discussed with the patient and family. After consideration of risks, benefits and other options for treatment, the patient has consented to  Procedure(s): ?RIGHT/LEFT HEART CATH AND CORONARY ANGIOGRAPHY (N/A) as a surgical intervention.  The patient's history has been reviewed, patient examined, no change in status, stable for surgery.  I have reviewed the patient's chart and labs.  Questions were answered to the patient's satisfaction.   ? ? ?Quay Burow ? ? ?

## 2021-10-13 ENCOUNTER — Encounter (HOSPITAL_COMMUNITY): Payer: Self-pay | Admitting: Cardiovascular Disease

## 2021-10-15 ENCOUNTER — Encounter: Payer: Self-pay | Admitting: Cardiovascular Disease

## 2021-10-27 ENCOUNTER — Ambulatory Visit (INDEPENDENT_AMBULATORY_CARE_PROVIDER_SITE_OTHER): Payer: Medicare Other | Admitting: Cardiovascular Disease

## 2021-10-27 ENCOUNTER — Encounter: Payer: Self-pay | Admitting: Cardiovascular Disease

## 2021-10-27 ENCOUNTER — Other Ambulatory Visit: Payer: Self-pay

## 2021-10-27 DIAGNOSIS — I350 Nonrheumatic aortic (valve) stenosis: Secondary | ICD-10-CM

## 2021-10-27 NOTE — Progress Notes (Signed)
? ? ? ?10/27/2021 ?[name]   ?[birth year]  ?[id number] ? ?Primary Physician Elias Else, MD ?Primary Cardiologist: Runell Gess MD Nicholes Calamity, [place] ? ?HPI:  [name] is a 76 y.o.  mildly overweight married Caucasian female mother of one 78 year old son, grandmother of 2 grandchildren who I last saw in the office 10/08/2021.  She is accompanied by her husband [name] today.  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  She is referred by her PCP, Dr. Tally Joe, for evaluation of severe aortic stenosis.  She apparently was in a RSV trial and had one of the physicians auscultate a murmur.  This was confirmed by her PCP who ordered a 2D echo at their practice which was notable for severe aortic stenosis.  She has minimal symptoms except for mild dyspnea when she walks up 2 sets of stairs in her [place] level townhome.  She denies chest pain or dizziness.  She has no other cardiac risk factors.  She is never had a heart attack or stroke.  There is no family history for heart disease. ? ?She has noticed increasing dyspnea with less exertion as well as more fatigue.  She denies chest pain.  We discussed performing right left heart cath to define her anatomy and physiology in anticipation of aortic valve replacement. ? ?I performed outpatient right and left heart cath on her 10/12/2021 through through the right radial/brachial approach.  I demonstrated normal coronary arteries and moderate to severe aortic stenosis with a valve area of 1 cm? with a valve index of 0.55 cm?/m? and a peak gradient of 40 mmHg.  She wishes to proceed with structural heart clinic evaluation for TAVR. ? ? ?Current Meds  ?Medication Sig  ? acetaminophen (TYLENOL) 500 MG tablet Take 500 mg by mouth every 6 (six) hours as needed for moderate pain.  ? acyclovir (ZOVIRAX) 400 MG tablet Take 400 mg by mouth 2 (two) times daily as needed (fever blisters).  ? brimonidine (ALPHAGAN P) 0.1 % SOLN Place 1 drop into both  eyes 2 (two) times daily.  ? Calcium Carb-Cholecalciferol (CALCIUM 600 + D PO) Take 1 tablet by mouth 2 (two) times daily.  ? cholecalciferol (VITAMIN D3) 25 MCG (1000 UNIT) tablet Take 1,000 Units by mouth daily. Midday  ? clindamycin (CLEOCIN) 150 MG capsule Take 600 mg by mouth See admin instructions. Take 1 hour before dental procedures  ? docusate sodium (COLACE) 100 MG capsule Take 100 mg by mouth 2 (two) times daily.  ? dorzolamide-timolol (COSOPT) 22.3-6.8 MG/ML ophthalmic solution Place 1 drop into both eyes 2 (two) times daily.  ? famotidine (PEPCID) 20 MG tablet Take 20 mg by mouth daily as needed for indigestion.  ? Flaxseed, Linseed, (FLAX SEED OIL) 1000 MG CAPS Take 1,000 mg by mouth daily.  ? gabapentin (NEURONTIN) 300 MG capsule Take 300 mg by mouth 3 (three) times daily as needed (pain).  ? Glucosamine-Chondroit-Vit C-Mn (GLUCOSAMINE 1500 COMPLEX PO) Take 1 tablet by mouth 2 (two) times daily.  ? guaiFENesin 200 MG tablet Take 100 mg by mouth every 8 (eight) hours as needed for cough or to loosen phlegm. Lavonia Dana Mucinex  ? latanoprost (XALATAN) 0.005 % ophthalmic solution Place 1 drop into both eyes at bedtime.  ? Lidocaine HCl 4 % CREA Apply 1 application topically daily as needed (pain).  ? Liniments (SALONPAS EX) Place 1 patch onto the skin at bedtime as needed (for pain). Pain  patch  ? meclizine (ANTIVERT) 25 MG tablet Take 25 mg by mouth 3 (three) times daily as needed (for motion sickness).  ? Multiple Vitamin (MULTIVITAMIN WITH MINERALS) TABS tablet Take 1 tablet by mouth daily.  ? naproxen sodium (ALEVE) 220 MG tablet Take 220 mg by mouth daily as needed (pain).  ? polyethylene glycol (MIRALAX / GLYCOLAX) packet Take 17 g by mouth daily.  ? Propylene Glycol (SYSTANE COMPLETE) 0.6 % SOLN Place 1 drop into both eyes as needed (dry eyes).  ? Psyllium (METAMUCIL PO) Take 1 capsule by mouth 2 (two) times daily.  ? raloxifene (EVISTA) 60 MG tablet Take 60 mg by mouth daily.  ? SODIUM  FLUORIDE 5000 PPM 1.1 % PSTE Place 1 application onto teeth at bedtime.  ?  ? ?Allergies  ?Allergen Reactions  ? Codeine Nausea Only  ? Keflex [Cephalexin] Rash  ? ? ?Social History  ? ?Socioeconomic History  ? Marital status: Married  ?  Spouse name: Not on file  ? Number of children: Not on file  ? Years of education: Not on file  ? Highest education level: Not on file  ?Occupational History  ? Not on file  ?Tobacco Use  ? Smoking status: Never  ? Smokeless tobacco: Never  ?Substance and Sexual Activity  ? Alcohol use: No  ? Drug use: No  ? Sexual activity: Not on file  ?Other Topics Concern  ? Not on file  ?Social History Narrative  ? Not on file  ? ?Social Determinants of Health  ? ?Financial Resource Strain: Not on file  ?Food Insecurity: Not on file  ?Transportation Needs: Not on file  ?Physical Activity: Not on file  ?Stress: Not on file  ?Social Connections: Not on file  ?Intimate Partner Violence: Not on file  ?  ? ?Review of Systems: ?General: negative for chills, fever, night sweats or weight changes.  ?Cardiovascular: negative for chest pain, dyspnea on exertion, edema, orthopnea, palpitations, paroxysmal nocturnal dyspnea or shortness of breath ?Dermatological: negative for rash ?Respiratory: negative for cough or wheezing ?Urologic: negative for hematuria ?Abdominal: negative for nausea, vomiting, diarrhea, bright red blood per rectum, melena, or hematemesis ?Neurologic: negative for visual changes, syncope, or dizziness ?All other systems reviewed and are otherwise negative except as noted above. ? ? ? ?Blood pressure 134/78, pulse 77, height 5\' 2"  (1.575 m), weight 184 lb 12.8 oz (83.8 kg), SpO2 99 %.  ?General appearance: alert and no distress ?Neck: no adenopathy, no JVD, supple, symmetrical, trachea midline, thyroid not enlarged, symmetric, no tenderness/mass/nodules, and bilateral carotid bruits versus transmitted murmur ?Lungs: clear to auscultation bilaterally ?Heart: 3/6 outflow tract murmur  consistent with aortic stenosis ?Extremities: extremities normal, atraumatic, no cyanosis or edema ?Pulses: 2+ and symmetric ?Skin: Skin color, texture, turgor normal. No rashes or lesions ?Neurologic: Grossly normal ? ?EKG not performed today ? ?ASSESSMENT AND PLAN:  ? ?Severe aortic stenosis ?Ms. Delong returns today for follow-up of her outpatient right left heart cath which I performed 10/12/2021 via the radial/brachial approach.  Her coronary arteries were normal.  Her valve area was 1 cm?12/12/2021  The aortic valve gradient was 40 mmHg.  She is progressively symptomatic and would benefit from seeing the structural heart team for the "heart team approach" for TAVR versus SAVR. ? ? ? ? ?Marland Kitchen MD FACP,FACC,FAHA, FSCAI ?10/27/2021 ?10:12 AM ?

## 2021-10-27 NOTE — Assessment & Plan Note (Signed)
[name] returns today for follow-up of her outpatient right left heart cath which I performed 10/12/2021 via the radial/brachial approach.  Her coronary arteries were normal.  Her valve area was 1 cm?Marland Kitchen  The aortic valve gradient was 40 mmHg.  She is progressively symptomatic and would benefit from seeing the structural heart team for the "heart team approach" for TAVR versus SAVR. ?

## 2021-10-27 NOTE — Patient Instructions (Signed)
Medication Instructions:  Your physician recommends that you continue on your current medications as directed. Please refer to the Current Medication list given to you today.  *If you need a refill on your cardiac medications before your next appointment, please call your pharmacy*   Follow-Up: At [facility], you and your health needs are our priority.  As part of our continuing mission to provide you with exceptional heart care, we have created designated Provider Care Teams.  These Care Teams include your primary Cardiologist (physician) and Advanced Practice Providers (APPs -  Physician Assistants and Nurse Practitioners) who all work together to provide you with the care you need, when you need it.  We recommend signing up for the patient portal called "MyChart".  Sign up information is provided on this After Visit Summary.  MyChart is used to connect with patients for Virtual Visits (Telemedicine).  Patients are able to view lab/test results, encounter notes, upcoming appointments, etc.  Non-urgent messages can be sent to your provider as well.   To learn more about what you can do with MyChart, go to [email or web address].    Your next appointment:   3 month(s)  The format for your next appointment:   In Person  Provider:   Jonathan Berry, MD 

## 2021-11-02 ENCOUNTER — Other Ambulatory Visit: Payer: Self-pay

## 2021-11-02 ENCOUNTER — Ambulatory Visit (INDEPENDENT_AMBULATORY_CARE_PROVIDER_SITE_OTHER): Payer: Medicare Other | Admitting: Cardiovascular Disease

## 2021-11-02 ENCOUNTER — Other Ambulatory Visit: Payer: Self-pay | Admitting: Physician Assistant

## 2021-11-02 ENCOUNTER — Encounter: Payer: Self-pay | Admitting: Cardiovascular Disease

## 2021-11-02 ENCOUNTER — Encounter: Payer: Self-pay | Admitting: Physician Assistant

## 2021-11-02 VITALS — BP 138/70 | HR 85 | Ht 62.0 in | Wt 186.6 lb

## 2021-11-02 DIAGNOSIS — I350 Nonrheumatic aortic (valve) stenosis: Secondary | ICD-10-CM | POA: Diagnosis not present

## 2021-11-02 DIAGNOSIS — Z01812 Encounter for preprocedural laboratory examination: Secondary | ICD-10-CM | POA: Diagnosis not present

## 2021-11-02 NOTE — Progress Notes (Signed)
? ?[facility] Consult Note ? ?Chief Complaint  ?Patient presents with  ? New Patient (Initial Visit)  ?  Severe aortic stenosis  ? ?History of Present Illness: 76 yo female with history of arthritis and severe aortic stenosis here today as a new consult, referred by Dr. Gwenlyn Found, for further discussion regarding her aortic stenosis and possible TAVR. She has been evaluated by Dr. Gwenlyn Found for aortic stenosis. Echo in November 2022 with LVEF=60-65%, mild MR. Severe thickening and calcification of the aortic valve with severe stenosis. Mean gradient 46.9 mmHg, peak gradient 84.8 mmHg, AVA 0.79 cm2, DI 0.28. Mild to moderate AI. Cardiac cath 10/12/21 with no evidence of CAD. Mean gradient 40 mmHg. Normal right and left heart pressures.  ? ?She tells me today that she has had progressive dyspnea with moderate exertion such as climbing hills and steps. She has progressive fatigue. She has no chest pain, dizziness or near syncope. She has lower extremity edema some days that resolves at night. She is retired from Building [employer]. She lives in [place] with her husband. She goes to the dentist regularly and has no active issues.  ? ?Primary Care Physician: Maury Dus, MD ?Primary Cardiologist: Gwenlyn Found ?Referring Cardiologist: Gwenlyn Found ? ?Past Medical History:  ?Diagnosis Date  ? Aortic stenosis   ? Arthritis   ? Complication of anesthesia   ? Diverticulosis   ? Glaucoma   ? ? ?Past Surgical History:  ?Procedure Laterality Date  ? ABDOMINAL HYSTERECTOMY  84  ? BACK SURGERY  04,06  ? DIAGNOSTIC LAPAROSCOPY  77  ? endometriosis  ? JOINT REPLACEMENT Right 08,11  ? knee.hip  ? RIGHT/LEFT HEART CATH AND CORONARY ANGIOGRAPHY N/A 10/12/2021  ? Procedure: RIGHT/LEFT HEART CATH AND CORONARY ANGIOGRAPHY;  Surgeon: Lorretta Harp, MD;  Location: [facility];  Service: Cardiovascular;  Laterality: N/A;  ? TONSILLECTOMY  87  ? TOTAL KNEE ARTHROPLASTY Left 08/13/2013  ? Procedure: LEFT TOTAL KNEE ARTHROPLASTY;  Surgeon: Vickey Huger,  MD;  Location: [facility];  Service: Orthopedics;  Laterality: Left;  ? ? ?Current Outpatient Medications  ?Medication Sig Dispense Refill  ? acetaminophen (TYLENOL) 500 MG tablet Take 500 mg by mouth every 6 (six) hours as needed for moderate pain.    ? acyclovir (ZOVIRAX) 400 MG tablet Take 400 mg by mouth 2 (two) times daily as needed (fever blisters).    ? brimonidine (ALPHAGAN P) 0.1 % SOLN Place 1 drop into both eyes 2 (two) times daily.    ? Calcium Carb-Cholecalciferol (CALCIUM 600 + D PO) Take 1 tablet by mouth 2 (two) times daily.    ? cholecalciferol (VITAMIN D3) 25 MCG (1000 UNIT) tablet Take 1,000 Units by mouth daily. Midday    ? clindamycin (CLEOCIN) 150 MG capsule Take 600 mg by mouth See admin instructions. Take 1 hour before dental procedures    ? docusate sodium (COLACE) 100 MG capsule Take 100 mg by mouth 2 (two) times daily.    ? dorzolamide-timolol (COSOPT) 22.3-6.8 MG/ML ophthalmic solution Place 1 drop into both eyes 2 (two) times daily.    ? famotidine (PEPCID) 20 MG tablet Take 20 mg by mouth daily as needed for indigestion.    ? Flaxseed, Linseed, (FLAX SEED OIL) 1000 MG CAPS Take 1,000 mg by mouth daily.    ? gabapentin (NEURONTIN) 300 MG capsule Take 300 mg by mouth 3 (three) times daily as needed (pain).    ? Glucosamine-Chondroit-Vit C-Mn (GLUCOSAMINE 1500 COMPLEX PO) Take 1 tablet by mouth 2 (two)  times daily.    ? guaiFENesin 200 MG tablet Take 100 mg by mouth every 8 (eight) hours as needed for cough or to loosen phlegm. Baron Sane Mucinex    ? latanoprost (XALATAN) 0.005 % ophthalmic solution Place 1 drop into both eyes at bedtime.    ? Lidocaine HCl 4 % CREA Apply 1 application topically daily as needed (pain).    ? Liniments (SALONPAS EX) Place 1 patch onto the skin at bedtime as needed (for pain). Pain patch    ? meclizine (ANTIVERT) 25 MG tablet Take 25 mg by mouth 3 (three) times daily as needed (for motion sickness).    ? Multiple Vitamin (MULTIVITAMIN WITH MINERALS) TABS  tablet Take 1 tablet by mouth daily.    ? naproxen sodium (ALEVE) 220 MG tablet Take 220 mg by mouth daily as needed (pain).    ? polyethylene glycol (MIRALAX / GLYCOLAX) packet Take 17 g by mouth daily.    ? Propylene Glycol (SYSTANE COMPLETE) 0.6 % SOLN Place 1 drop into both eyes as needed (dry eyes).    ? Psyllium (METAMUCIL PO) Take 1 capsule by mouth 2 (two) times daily.    ? raloxifene (EVISTA) 60 MG tablet Take 60 mg by mouth daily.    ? SODIUM FLUORIDE 5000 PPM 1.1 % PSTE Place 1 application onto teeth at bedtime.    ? ?No current facility-administered medications for this visit.  ? ? ?Allergies  ?Allergen Reactions  ? Codeine Nausea Only  ? Keflex [Cephalexin] Rash  ? ? ?Social History  ? ?Socioeconomic History  ? Marital status: Married  ?  Spouse name: Not on file  ? Number of children: 1  ? Years of education: Not on file  ? Highest education level: Not on file  ?Occupational History  ? Occupation: Retired nurse-pediatrics  ?Tobacco Use  ? Smoking status: Never  ? Smokeless tobacco: Never  ?Substance and Sexual Activity  ? Alcohol use: No  ? Drug use: No  ? Sexual activity: Not on file  ?Other Topics Concern  ? Not on file  ?Social History Narrative  ? Not on file  ? ?Social Determinants of Health  ? ?Financial Resource Strain: Not on file  ?Food Insecurity: Not on file  ?Transportation Needs: Not on file  ?Physical Activity: Not on file  ?Stress: Not on file  ?Social Connections: Not on file  ?Intimate Partner Violence: Not on file  ? ? ?Family History  ?Problem Relation Age of Onset  ? Hypertension Mother   ? Peripheral Artery Disease Mother   ? Atrial fibrillation Father   ? ? ?Review of Systems:  As stated in the HPI and otherwise negative.  ? ?BP 138/70   Pulse 85   Ht 5\' 2"  (1.575 m)   Wt 186 lb 9.6 oz (84.6 kg)   SpO2 99%   BMI 34.13 kg/m?  ? ?Physical Examination: ?General: Well developed, well nourished, NAD  ?HEENT: OP clear, mucus membranes moist  ?SKIN: warm, dry. No rashes. ?Neuro: No  focal deficits  ?Musculoskeletal: Muscle strength 5/5 all ext  ?Psychiatric: Mood and affect normal  ?Neck: No JVD, no carotid bruits, no thyromegaly, no lymphadenopathy.  ?Lungs:Clear bilaterally, no wheezes, rhonci, crackles ?Cardiovascular: Regular rate and rhythm. Loud, harsh, late peaking systolic murmur.  ?Abdomen:Soft. Bowel sounds present. Non-tender.  ?Extremities: No lower extremity edema. Pulses are 2 + in the bilateral DP/PT. ? ?EKG:  EKG is not ordered today. ?The ekg from 10/08/21 is reviewed and shows sinus with  non-specific T wave abnormaltity ? ?Echo 06/23/21: ? 1. Left ventricular ejection fraction, by estimation, is 60 to 65%. The  ?left ventricle has normal function. The left ventricle has no regional  ?wall motion abnormalities. Left ventricular diastolic parameters are  ?consistent with Grade I diastolic  ?dysfunction (impaired relaxation).  ? 2. Right ventricular systolic function is normal. The right ventricular  ?size is normal. There is normal pulmonary artery systolic pressure.  ? 3. Left atrial size was moderately dilated.  ? 4. The mitral valve is normal in structure. Mild mitral valve  ?regurgitation. No evidence of mitral stenosis.  ? 5. Tricuspid valve regurgitation is mild to moderate.  ? 6. The aortic valve has an indeterminant number of cusps. There is severe  ?calcifcation of the aortic valve. There is severe thickening of the aortic  ?valve. Aortic valve regurgitation is mild to moderate. Severe aortic valve  ?stenosis. Aortic valve mean  ?gradient measures 46.9 mmHg. Aortic valve Vmax measures 4.61 m/s. Aortic  ?valve acceleration time measures 116 msec.  ? 7. The inferior vena cava is dilated in size with >50% respiratory  ?variability, suggesting right atrial pressure of 8 mmHg.  ? ?FINDINGS  ? Left Ventricle: Left ventricular ejection fraction, by estimation, is 60  ?to 65%. The left ventricle has normal function. The left ventricle has no  ?regional wall motion abnormalities.  The left ventricular internal cavity  ?size was normal in size. There is  ? no left ventricular hypertrophy. Left ventricular diastolic parameters  ?are consistent with Grade I diastolic dysfunction (impaired

## 2021-11-02 NOTE — Progress Notes (Addendum)
Pre Surgical Assessment: 5 M Walk Test ? ?12M=16.55ft ? ?5 Meter Walk Test- trial 1: 7.51 seconds ?5 Meter Walk Test- trial 2: 6.17 seconds ?5 Meter Walk Test- trial 3: 6.05 seconds ?5 Meter Walk Test Average: 6.57 seconds ? ?_______________________ ? ?Procedure: Isolated AVR ?Risk of Mortality: ?2.813% ?Renal Failure: ?2.294% ?Permanent Stroke: ?1.260% ?Prolonged Ventilation: ?7.255% ?DSW Infection: ?0.101% ?Reoperation: ?4.586% ?Morbidity or Mortality: ?11.881% ?Short Length of Stay: ?36.125% ?Long Length of Stay: ?5.065% ?

## 2021-11-02 NOTE — Patient Instructions (Signed)
Medication Instructions:  ?No changes ?*If you need a refill on your cardiac medications before your next appointment, please call your pharmacy* ? ? ?Lab Work: ?none ? ? ?Testing/Procedures: ?CT scans will be scheduled and you will be contacted with instructions. ? ? ?Follow-Up: ?Per Structural Heart Valve Team ?

## 2021-11-03 LAB — BASIC METABOLIC PANEL
BUN/Creatinine Ratio: 15 (ref 12–28)
BUN: 18 mg/dL (ref 8–27)
CO2: 24 mmol/L (ref 20–29)
Calcium: 9.5 mg/dL (ref 8.7–10.3)
Chloride: 103 mmol/L (ref 96–106)
Creatinine, Ser: 1.19 mg/dL — ABNORMAL HIGH (ref 0.57–1.00)
Glucose: 109 mg/dL — ABNORMAL HIGH (ref 70–99)
Potassium: 4 mmol/L (ref 3.5–5.2)
Sodium: 141 mmol/L (ref 134–144)
eGFR: 48 mL/min/{1.73_m2} — ABNORMAL LOW (ref >59)

## 2021-11-12 ENCOUNTER — Ambulatory Visit (HOSPITAL_COMMUNITY)
Admission: RE | Admit: 2021-11-12 | Discharge: 2021-11-12 | Disposition: A | Discharge status: Home / Self Care | Payer: Medicare Other | Source: Ambulatory Visit | Attending: Physician Assistant | Admitting: Physician Assistant

## 2021-11-12 DIAGNOSIS — I350 Nonrheumatic aortic (valve) stenosis: Secondary | ICD-10-CM

## 2021-11-12 MED ORDER — IOHEXOL 350 MG/ML SOLN
80.0000 mL | Freq: Once | INTRAVENOUS | Status: AC | PRN
  Administered 2021-11-12: 80 mL via INTRAVENOUS

## 2021-12-22 ENCOUNTER — Other Ambulatory Visit: Payer: Self-pay

## 2021-12-22 DIAGNOSIS — I350 Nonrheumatic aortic (valve) stenosis: Secondary | ICD-10-CM

## 2021-12-23 ENCOUNTER — Institutional Professional Consult (permissible substitution) (INDEPENDENT_AMBULATORY_CARE_PROVIDER_SITE_OTHER): Payer: Medicare Other | Admitting: Surgery

## 2021-12-23 ENCOUNTER — Encounter: Payer: Self-pay | Admitting: Surgery

## 2021-12-23 VITALS — BP 149/85 | HR 72 | Resp 20 | Ht 62.0 in | Wt 186.0 lb

## 2021-12-23 DIAGNOSIS — I350 Nonrheumatic aortic (valve) stenosis: Secondary | ICD-10-CM

## 2021-12-23 NOTE — Progress Notes (Signed)
Patient ID: [name], female   DOB: 1945/09/10, 76 y.o.   MRN: [id number]  [facility]         [address]       [place] [address]             [phone]          CARDIOTHORACIC SURGERY CONSULTATION REPORT  PCP is Maury Dus, MD Referring Provider is Lauree Chandler, MD Primary Cardiologist is Quay Burow, MD  Reason for consultation:  Severe aortic stenosis   HPI:  The patient is a 76 year old woman with arthritis, glaucoma and severe aortic stenosis referred for consideration of AVR. An echo in 06/2021 showed a mean gradient across the aortic valve of 47 mm Hg with an AVA of 0.79 cm2 with mild to moderate AI. Cardiac cath showed no CAD with a mean gradient of 40 mm Hg.   She is here today with her husband. She reports progressive exertional shortness of breath and fatigue with moderate exertion. She has to stop to rest going up stairs and can't walk up hills. She has had some lower extremity edema that resolves overnight. She denies any chest discomfort or dizziness.  Past Medical History:  Diagnosis Date   Arthritis    Complication of anesthesia    Diverticulosis    Glaucoma    Severe aortic stenosis     Past Surgical History:  Procedure Laterality Date   ABDOMINAL HYSTERECTOMY  84   BACK SURGERY  04,06   DIAGNOSTIC LAPAROSCOPY  77   endometriosis   JOINT REPLACEMENT Right 08,11   [email or web address]   RIGHT/LEFT HEART CATH AND CORONARY ANGIOGRAPHY N/A 10/12/2021   Procedure: RIGHT/LEFT HEART CATH AND CORONARY ANGIOGRAPHY;  Surgeon: Lorretta Harp, MD;  Location: [facility];  Service: Cardiovascular;  Laterality: N/A;   TONSILLECTOMY  87   TOTAL KNEE ARTHROPLASTY Left 08/13/2013   Procedure: LEFT TOTAL KNEE ARTHROPLASTY;  Surgeon: Vickey Huger, MD;  Location: [facility];  Service: Orthopedics;  Laterality: Left;    Family History  Problem Relation Age of Onset   Hypertension Mother     Peripheral Artery Disease Mother    Atrial fibrillation Father     Social History   Socioeconomic History   Marital status: Married    Spouse name: Not on file   Number of children: 1   Years of education: Not on file   Highest education level: Not on file  Occupational History   Occupation: Retired nurse-pediatrics  Tobacco Use   Smoking status: Never   Smokeless tobacco: Never  Substance and Sexual Activity   Alcohol use: No   Drug use: No   Sexual activity: Not on file  Other Topics Concern   Not on file  Social History Narrative   Not on file   Social Determinants of Health   Financial Resource Strain: Not on file  Food Insecurity: Not on file  Transportation Needs: Not on file  Physical Activity: Not on file  Stress: Not on file  Social Connections: Not on file  Intimate Partner Violence: Not on file    Prior to Admission medications   Medication Sig Start Date End Date Taking? Authorizing Provider  acetaminophen (TYLENOL) 500 MG tablet Take 500 mg by mouth every 6 (six) hours as needed for moderate pain.   Yes [provider]  acyclovir (ZOVIRAX) 400 MG tablet Take 400 mg by mouth 2 (two)  times daily as needed (fever blisters). 09/03/21  Yes [provider]  brimonidine (ALPHAGAN P) 0.1 % SOLN Place 1 drop into both eyes 2 (two) times daily.   Yes [provider]  Calcium Carb-Cholecalciferol (CALCIUM 600 + D PO) Take 1 tablet by mouth 2 (two) times daily.   Yes [provider]  cholecalciferol (VITAMIN D3) 25 MCG (1000 UNIT) tablet Take 1,000 Units by mouth daily. Midday   Yes [provider]  clindamycin (CLEOCIN) 150 MG capsule Take 600 mg by mouth See admin instructions. Take 1 hour before dental procedures   Yes [provider]  docusate sodium (COLACE) 100 MG capsule Take 100 mg by mouth 2 (two) times daily.   Yes [provider]  dorzolamide-timolol (COSOPT) 22.3-6.8 MG/ML ophthalmic solution  Place 1 drop into both eyes 2 (two) times daily.   Yes [provider]  famotidine (PEPCID) 20 MG tablet Take 20 mg by mouth daily as needed for indigestion.   Yes [provider]  Flaxseed, Linseed, (FLAX SEED OIL) 1000 MG CAPS Take 1,000 mg by mouth daily.   Yes [provider]  gabapentin (NEURONTIN) 300 MG capsule Take 300 mg by mouth 3 (three) times daily as needed (pain).   Yes [provider]  Glucosamine-Chondroit-Vit C-Mn (GLUCOSAMINE 1500 COMPLEX PO) Take 1 tablet by mouth 2 (two) times daily.   Yes [provider]  guaiFENesin 200 MG tablet Take 100 mg by mouth every 8 (eight) hours as needed for cough or to loosen phlegm. [place] Brand Mucinex   Yes [provider]  latanoprost (XALATAN) 0.005 % ophthalmic solution Place 1 drop into both eyes at bedtime. 09/03/21  Yes [provider]  Lidocaine HCl 4 % CREA Apply 1 application topically daily as needed (pain).   Yes [provider]  Liniments (SALONPAS EX) Place 1 patch onto the skin at bedtime as needed (for pain). Pain patch   Yes [provider]  meclizine (ANTIVERT) 25 MG tablet Take 25 mg by mouth 3 (three) times daily as needed (for motion sickness).   Yes [provider]  Multiple Vitamin (MULTIVITAMIN WITH MINERALS) TABS tablet Take 1 tablet by mouth daily.   Yes [provider]  naproxen sodium (ALEVE) 220 MG tablet Take 220 mg by mouth daily as needed (pain).   Yes [provider]  polyethylene glycol (MIRALAX / GLYCOLAX) packet Take 17 g by mouth daily.   Yes [provider]  Propylene Glycol (SYSTANE COMPLETE) 0.6 % SOLN Place 1 drop into both eyes as needed (dry eyes).   Yes [provider]  Psyllium (METAMUCIL PO) Take 1 capsule by mouth 2 (two) times daily.   Yes [provider]  raloxifene (EVISTA) 60 MG tablet Take 60 mg by mouth daily. 05/14/21  Yes [provider]  SODIUM  FLUORIDE 5000 PPM 1.1 % PSTE Place 1 application onto teeth at bedtime. 05/25/21  Yes [provider]    Current Outpatient Medications  Medication Sig Dispense Refill   acetaminophen (TYLENOL) 500 MG tablet Take 500 mg by mouth every 6 (six) hours as needed for moderate pain.     acyclovir (ZOVIRAX) 400 MG tablet Take 400 mg by mouth 2 (two) times daily as needed (fever blisters).     brimonidine (ALPHAGAN P) 0.1 % SOLN Place 1 drop into both eyes 2 (two) times daily.     Calcium Carb-Cholecalciferol (CALCIUM 600 + D PO) Take 1 tablet by mouth 2 (two) times  daily.     cholecalciferol (VITAMIN D3) 25 MCG (1000 UNIT) tablet Take 1,000 Units by mouth daily. Midday     clindamycin (CLEOCIN) 150 MG capsule Take 600 mg by mouth See admin instructions. Take 1 hour before dental procedures     docusate sodium (COLACE) 100 MG capsule Take 100 mg by mouth 2 (two) times daily.     dorzolamide-timolol (COSOPT) 22.3-6.8 MG/ML ophthalmic solution Place 1 drop into both eyes 2 (two) times daily.     famotidine (PEPCID) 20 MG tablet Take 20 mg by mouth daily as needed for indigestion.     Flaxseed, Linseed, (FLAX SEED OIL) 1000 MG CAPS Take 1,000 mg by mouth daily.     gabapentin (NEURONTIN) 300 MG capsule Take 300 mg by mouth 3 (three) times daily as needed (pain).     Glucosamine-Chondroit-Vit C-Mn (GLUCOSAMINE 1500 COMPLEX PO) Take 1 tablet by mouth 2 (two) times daily.     guaiFENesin 200 MG tablet Take 100 mg by mouth every 8 (eight) hours as needed for cough or to loosen phlegm. [place] Brand Mucinex     latanoprost (XALATAN) 0.005 % ophthalmic solution Place 1 drop into both eyes at bedtime.     Lidocaine HCl 4 % CREA Apply 1 application topically daily as needed (pain).     Liniments (SALONPAS EX) Place 1 patch onto the skin at bedtime as needed (for pain). Pain patch     meclizine (ANTIVERT) 25 MG tablet Take 25 mg by mouth 3 (three) times daily as needed (for motion sickness).     Multiple  Vitamin (MULTIVITAMIN WITH MINERALS) TABS tablet Take 1 tablet by mouth daily.     naproxen sodium (ALEVE) 220 MG tablet Take 220 mg by mouth daily as needed (pain).     polyethylene glycol (MIRALAX / GLYCOLAX) packet Take 17 g by mouth daily.     Propylene Glycol (SYSTANE COMPLETE) 0.6 % SOLN Place 1 drop into both eyes as needed (dry eyes).     Psyllium (METAMUCIL PO) Take 1 capsule by mouth 2 (two) times daily.     raloxifene (EVISTA) 60 MG tablet Take 60 mg by mouth daily.     SODIUM FLUORIDE 5000 PPM 1.1 % PSTE Place 1 application onto teeth at bedtime.     No current facility-administered medications for this visit.    Allergies  Allergen Reactions   Codeine Nausea Only   Keflex [Cephalexin] Rash      Review of Systems:   General:  normal appetite, + decreased energy, no weight gain, no weight loss, no fever  Cardiac:  no chest pain with exertion, no chest pain at rest, + SOB with moderate exertion, no resting SOB, no PND, no orthopnea, no palpitations, no arrhythmia, no atrial fibrillation, + LE edema, no dizzy spells, no syncope  Respiratory:  + exertional shortness of breath, no home oxygen, no productive cough, no dry cough, no bronchitis, no wheezing, no hemoptysis, no asthma, no pain with inspiration or cough, no sleep apnea, no CPAP at night  GI:   no difficulty swallowing, no reflux, no frequent heartburn, no hiatal hernia, no abdominal pain, + constipation, no diarrhea, no hematochezia, no hematemesis, no melena  GU:   no dysuria,  no frequency, no urinary tract infection, no hematuria, no kidney stones, no kidney disease  Vascular:  no pain suggestive of claudication, no pain in feet, no leg cramps, no varicose veins, no DVT, no non-healing foot ulcer  Neuro:   no stroke, no  TIA's, no seizures, no headaches, no temporary blindness one eye,  no slurred speech, no peripheral neuropathy, no chronic pain, + instability of gait, no memory/cognitive  dysfunction  Musculoskeletal: + arthritis, no joint swelling, no myalgias, + difficulty walking, + decreased mobility and uses a cane for balance  Skin:   no rash, no itching, no skin infections, no pressure sores or ulcerations  Psych:   no anxiety, no depression, no nervousness, no unusual recent stress  Eyes:   no blurry vision, no floaters, no recent vision changes, + wears glasses   ENT:   no hearing loss, no loose or painful teeth, no dentures, last saw dentist fall 2022  Hematologic:  + easy bruising, no abnormal bleeding, no clotting disorder, no frequent epistaxis  Endocrine:  no diabetes, does not check CBG's at home     Physical Exam:   BP (!) 149/85 (BP Location: Left Arm, Patient Position: Sitting)   Pulse 72   Resp 20   Ht 5\' 2"  (1.575 m)   Wt 186 lb (84.4 kg)   SpO2 97% Comment: RA  BMI 34.02 kg/m   General:  well-appearing  HEENT:  Unremarkable, NCAT, PERLA, EOMI  Neck:   no JVD, no bruits, no adenopathy   Chest:   clear to auscultation, symmetrical breath sounds, no wheezes, no rhonchi   CV:   RRR, 3/6 systolic murmur RSB, no diastolic murmur  Abdomen:  soft, non-tender, no masses or organomegaly  Extremities:  warm, well-perfused, pulses palpable at ankle, no lower extremity edema  Rectal/GU  Deferred  Neuro:   Grossly non-focal and symmetrical throughout  Skin:   Clean and dry, no rashes, no breakdown  Diagnostic Tests:    ECHOCARDIOGRAM REPORT         Patient Name:   DONNIELLE YECK Date of Exam: 06/23/2021  Medical Rec #:  [id number]      Height:       62.0 in  Accession #:    [id number]     Weight:       180.8 lb  Date of Birth:  October 17, 1945      BSA:          1.831 m  Patient Age:    75 years       BP:           119/73 mmHg  Patient Gender: F              HR:           68 bpm.  Exam Location:  [address]   Procedure: 2D Echo, Cardiac Doppler and Color Doppler   Indications:    I35.0 Aortic Stenosis     History:        Patient has no prior  history of Echocardiogram  examinations.     Sonographer:    Sedonia Small Rodgers-Jones RDCS  Referring Phys: [id number] JONATHAN J BERRY   IMPRESSIONS     1. Left ventricular ejection fraction, by estimation, is 60 to 65%. The  left ventricle has normal function. The left ventricle has no regional  wall motion abnormalities. Left ventricular diastolic parameters are  consistent with Grade I diastolic  dysfunction (impaired relaxation).   2. Right ventricular systolic function is normal. The right ventricular  size is normal. There is normal pulmonary artery systolic pressure.   3. Left atrial size was moderately dilated.   4. The mitral valve is normal in structure. Mild mitral valve  regurgitation. No evidence of mitral  stenosis.   5. Tricuspid valve regurgitation is mild to moderate.   6. The aortic valve has an indeterminant number of cusps. There is severe  calcifcation of the aortic valve. There is severe thickening of the aortic  valve. Aortic valve regurgitation is mild to moderate. Severe aortic valve  stenosis. Aortic valve mean  gradient measures 46.9 mmHg. Aortic valve Vmax measures 4.61 m/s. Aortic  valve acceleration time measures 116 msec.   7. The inferior vena cava is dilated in size with >50% respiratory  variability, suggesting right atrial pressure of 8 mmHg.   FINDINGS   Left Ventricle: Left ventricular ejection fraction, by estimation, is 60  to 65%. The left ventricle has normal function. The left ventricle has no  regional wall motion abnormalities. The left ventricular internal cavity  size was normal in size. There is   no left ventricular hypertrophy. Left ventricular diastolic parameters  are consistent with Grade I diastolic dysfunction (impaired relaxation).  Normal left ventricular filling pressure.   Right Ventricle: The right ventricular size is normal. No increase in  right ventricular wall thickness. Right ventricular systolic function is  normal.  There is normal pulmonary artery systolic pressure. The tricuspid  regurgitant velocity is 2.57 m/s, and   with an assumed right atrial pressure of 8 mmHg, the estimated right  ventricular systolic pressure is 99991111 mmHg.   Left Atrium: Left atrial size was moderately dilated.   Right Atrium: Right atrial size was normal in size.   Pericardium: There is no evidence of pericardial effusion.   Mitral Valve: The mitral valve is normal in structure. There is mild  thickening of the mitral valve leaflet(s). Mild mitral annular  calcification. Mild mitral valve regurgitation. No evidence of mitral  valve stenosis.   Tricuspid Valve: The tricuspid valve is normal in structure. Tricuspid  valve regurgitation is mild to moderate. No evidence of tricuspid  stenosis.   Aortic Valve: The aortic valve has an indeterminant number of cusps. There  is severe calcifcation of the aortic valve. There is severe thickening of  the aortic valve. Aortic valve regurgitation is mild to moderate. Aortic  regurgitation PHT measures 487  msec. Severe aortic stenosis is present. Aortic valve mean gradient  measures 46.9 mmHg. Aortic valve peak gradient measures 84.8 mmHg. Aortic  valve area, by VTI measures 0.79 cm.   Pulmonic Valve: The pulmonic valve was normal in structure. Pulmonic valve  regurgitation is mild. No evidence of pulmonic stenosis.   Aorta: The aortic root is normal in size and structure.   Venous: The inferior vena cava is dilated in size with greater than 50%  respiratory variability, suggesting right atrial pressure of 8 mmHg.   IAS/Shunts: No atrial level shunt detected by color flow Doppler.      LEFT VENTRICLE  PLAX 2D  LVIDd:         4.80 cm   Diastology  LVIDs:         3.00 cm   LV e' medial:    8.55 cm/s  LV PW:         1.00 cm   LV E/e' medial:  6.7  LV IVS:        0.60 cm   LV e' lateral:   9.48 cm/s  LVOT diam:     1.90 cm   LV E/e' lateral: 6.0  LV SV:         87  LV  SV Index:   48  LVOT Area:  2.84 cm      RIGHT VENTRICLE  RV Basal diam:  3.10 cm  RV S prime:     16.00 cm/s  TAPSE (M-mode): 2.8 cm   LEFT ATRIUM             Index        RIGHT ATRIUM           Index  LA diam:        4.70 cm 2.57 cm/m   RA Area:     17.90 cm  LA Vol (A2C):   48.5 ml 26.48 ml/m  RA Volume:   49.20 ml  26.86 ml/m  LA Vol (A4C):   52.9 ml 28.88 ml/m  LA Biplane Vol: 50.5 ml 27.57 ml/m   AORTIC VALVE  AV Area (Vmax):    0.83 cm  AV Area (Vmean):   0.86 cm  AV Area (VTI):     0.79 cm  AV Vmax:           460.51 cm/s  AV Vmean:          299.779 cm/s  AV VTI:            1.108 m  AV Peak Grad:      84.8 mmHg  AV Mean Grad:      46.9 mmHg  LVOT Vmax:         135.00 cm/s  LVOT Vmean:        90.850 cm/s  LVOT VTI:          0.308 m  LVOT/AV VTI ratio: 0.28  AI PHT:            487 msec     AORTA  Ao Root diam: 2.90 cm  Ao Asc diam:  3.60 cm   MITRAL VALVE               TRICUSPID VALVE  MV Area (PHT): 2.80 cm    TR Peak grad:   26.4 mmHg  MV Decel Time: 271 msec    TR Vmax:        257.00 cm/s  MV E velocity: 57.10 cm/s  MV A velocity: 87.50 cm/s  SHUNTS  MV E/A ratio:  0.65        Systemic VTI:  0.31 m                             Systemic Diam: 1.90 cm   Dani Gobble Croitoru MD  Electronically signed by Sanda Klein MD  Signature Date/Time: 06/23/2021/4:32:26 PM         Final     Physicians  Panel Physicians Referring Physician Case Authorizing Physician  Lorretta Harp, MD (Primary)     Procedures  RIGHT/LEFT HEART CATH AND CORONARY ANGIOGRAPHY   Conclusion  PHILLIPPA LORANG is a 76 y.o. female      [id number] LOCATION:  FACILITY: [facility]  PHYSICIAN: Quay Burow, M.D. [birth year]     DATE OF PROCEDURE:  10/12/2021   DATE OF DISCHARGE:       CARDIAC CATHETERIZATION        History obtained from chart review.FLO PIGNOTTI is a 76 y.o.  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  She is referred by her PCP, Dr. Antony Contras,  for evaluation of severe aortic stenosis.  She apparently was in a RSV trial and had one of the physicians auscultate a murmur.  This was confirmed by her PCP who ordered a 2D echo at their practice which was notable for severe aortic stenosis.  She has minimal symptoms except for mild dyspnea when she walks up 2 sets of stairs in her 3 level townhome.  She denies chest pain or dizziness.  She has no other cardiac risk factors.  She is never had a heart attack or stroke.  There is no family history for heart disease.  Since I saw her 5 months ago she has noticed increasing dyspnea with less exertion as well as more fatigue.  She denies chest pain.  We discussed performing right left heart cath to define her anatomy and physiology in anticipation of aortic valve replacement.       HEMODYNAMICS: Right heart cath  1: Right atrial pressure-9/6 2: Right ventricular pressure-31/0 3: Pulmonary artery pressure-31/8, mean 17 4: Pulmonary wedge pressure-A-wave 14, V wave 11, mean 11 5: Aortic valve gradient 40.3 mm 6: Aortic valve area-1.02 cm with an index of 0.55 cm/m 7: Cardiac output-5.8 L/min with an index of 3.2 L/min/m 8: LVEDP-12     IMPRESSION: Ms. Geelan has clean coronary arteries with severe aortic stenosis.  She has progressive symptoms.  She will be a good candidate for TAVR.  I am going to refer her to the multidisciplinary structural clinic for further evaluation.  The right radial sheath was removed and a TR band was placed on the right wrist to achieve patent hemostasis.  The right antecubital sheath was removed as well.  The patient left the lab in stable condition.  She will be discharged home later this morning, and I will see her back in the office in 1 to 2 weeks for follow-up.   Quay Burow. MD,  [facility] 10/12/2021 8:08 AM         Procedural Details  Technical Details PROCEDURE DESCRIPTION:   The patient was brought to the second floor [facility] cath lab in the postabsorptive state.  She was premedicated with IV Versed and fentanyl.  Her right wrist and antecubital fossa Where prepped and shaved in usual sterile fashion. Xylocaine 1% was used for local anesthesia. A 6 French sheath was inserted into the right radial artery using standard Seldinger technique.  A 5 French sheath was inserted into the right antecubital fossa.  A 5 French balloontipped Swan-Ganz catheter was then advanced through the right heart chambers obtaining sequential pressures and pulmonary artery blood samples for the determination of Fick cardiac output.  5 [place] TIG and right Judkins catheters were used for selective coronary angiography and obtain left heart pressures.  Isovue dye was used for the entirety of the case (45 cc of contrast total to patient).  Retrograde aortic, left ventricular and pullback pressures were recorded.  The patient received 4000 units of heparin intravenously.  Radial cocktail was administered via the SideArm sheath. Estimated blood loss <50 mL.   During this procedure medications were administered to achieve and maintain moderate conscious sedation while the patient's heart rate, blood pressure, and oxygen saturation were continuously monitored and I was present face-to-face 100% of this time.   Medications (Filter: Administrations occurring from 0721 to 0805 on 10/12/21) Heparin (Porcine) in NaCl 1000-0.9 UT/500ML-% SOLN (mL) Total volume:  1,000 mL Date/Time Rate/Dose/Volume Action   10/12/21 0729 500 mL Given   0729 500 mL Given  midazolam (VERSED) injection (mg) Total dose:  1 mg Date/Time Rate/Dose/Volume Action   10/12/21 0738 1 mg Given    fentaNYL (SUBLIMAZE) injection (mcg) Total dose:  25 mcg Date/Time Rate/Dose/Volume Action   10/12/21 0739 25 mcg Given     lidocaine (PF) (XYLOCAINE) 1 % injection (mL) Total volume:  7 mL Date/Time Rate/Dose/Volume Action   10/12/21 0739 5 mL Given   0745 2 mL Given    Radial Cocktail (Verapamil 2.5 mg, NTG, Lidocaine) (mL) Total volume:  10 mL Date/Time Rate/Dose/Volume Action   10/12/21 0747 10 mL Given    heparin sodium (porcine) injection (Units) Total dose:  4,000 Units Date/Time Rate/Dose/Volume Action   10/12/21 0749 4,000 Units Given    iohexol (OMNIPAQUE) 350 MG/ML injection (mL) Total volume:  45 mL Date/Time Rate/Dose/Volume Action   10/12/21 0759 45 mL Given    Sedation Time  Sedation Time Physician-1: 19 minutes 58 seconds Contrast  Medication Name Total Dose  iohexol (OMNIPAQUE) 350 MG/ML injection 45 mL   Radiation/Fluoro  Fluoro time: 3.9 (min) DAP: [id number] (mGycm2) Cumulative Air Kerma: [id number] (mGy) Complications  Complications documented before study signed (10/12/2021  [id number] AM)   No complications were associated with this study.  Documented by Littie Deeds, RT - 10/12/2021  8:06 AM     Coronary Findings  Diagnostic Dominance: Right No diagnostic findings have been documented. Intervention  No interventions have been documented. Coronary Diagrams  Diagnostic Dominance: Right Intervention  Implants     No implant documentation for this case.   Syngo Images   Show images for CARDIAC CATHETERIZATION Images on Long Term Storage   Show images for Jenn, Hibbitts to Procedure Log  Procedure Log    Hemo Data  Flowsheet Row Most Recent Value  Fick Cardiac Output 5.84 L/min  Fick Cardiac Output Index 3.16 (L/min)/BSA  Aortic Mean Gradient 40.28 mmHg  Aortic Peak Gradient 40.8 mmHg  Aortic Valve Area 1.02  Aortic Value Area Index 0.55 cm2/BSA  RA A Wave 9 mmHg  RA V Wave 6 mmHg  RA Mean 4 mmHg  RV Systolic Pressure 31 mmHg  RV Diastolic Pressure -4 mmHg  RV EDP 12 mmHg  PA Systolic Pressure 31 mmHg  PA Diastolic Pressure 8 mmHg  PA Mean  17 mmHg  PW A Wave 14 mmHg  PW V Wave 11 mmHg  PW Mean 11 mmHg  AO Systolic Pressure 99 mmHg  AO Diastolic Pressure 52 mmHg  AO Mean 71 mmHg  LV Systolic Pressure 123456 mmHg  LV Diastolic Pressure 1 mmHg  LV EDP 12 mmHg  AOp Systolic Pressure 0000000 mmHg  AOp Diastolic Pressure 55 mmHg  AOp Mean Pressure 78 mmHg  LVp Systolic Pressure [id number] mmHg  LVp Diastolic Pressure 0 mmHg  LVp EDP Pressure 12 mmHg  QP/QS 1  TPVR Index 5.38 HRUI  TSVR Index 28.17 HRUI  PVR SVR Ratio 0.07  TPVR/TSVR Ratio 0.19    Addendum  ADDENDUM REPORT: 11/12/2021 22:08   CLINICAL DATA:  51F with severe aortic stenosis being evaluated for a TAVR procedure.   EXAM: Cardiac TAVR CT   TECHNIQUE: The patient was scanned on a [employer]. A 120 kV retrospective scan was triggered in the descending thoracic aorta at 111 HU's. Gantry rotation speed was 250 msecs and collimation was .6 mm. No beta blockade or nitro were given. The 3D data set was reconstructed in 5% intervals of the R-R cycle. Systolic and diastolic phases were analyzed on a  dedicated work station using MPR, MIP and VRT modes. The patient received 80 cc of contrast.   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 and non coronary cusps   Aortic valve calcium score: 2643   Aortic annulus:   Diameter: 37mm x 109mm   Perimeter: 52mm   Area: 374 mm^2   Calcifications: Severe calcification adjacent to noncoronary cusp   Coronary height: Min Left - 38mm, Max Left - 70mm; Min Right - 93mm   Sinotubular height: Left cusp - 8mm; Right cusp - 80mm; Noncoronary cusp - 91mm   LVOT (as measured 3 mm below the annulus):   Diameter: 4mm x 37mm   Area:  374mm^2   Calcifications: Calcification inferior to noncoronary cusp   Aortic sinus width: Left cusp - 88mm; Right cusp - 71mm; Noncoronary cusp - 60mm   Sinotubular junction width: 39mm x 31mm   Optimum  Fluoroscopic Angle for Delivery: LAO 18 CAU 5   Cardiac:   Right atrium: Moderately enlarged   Right ventricle: Moderately dilated   Pulmonary arteries: Normal size   Pulmonary veins: Normal configuration   Left atrium: Moderately enlarged   Left ventricle: Mildly dilated   Pericardium: Normal thickness   Coronary arteries: Coronary calcium score 202 (74th percentile)   IMPRESSION: 1. 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 Severely calcified (AV calcium score 2643)   2. Aortic annulus measures 85mm x 28mm in diameter with perimeter 54mm and area 374 mm^2. Severe annular calcification adjacent to noncoronary cusp extending into LVOT. Annular measurement suitable for delivery of 23 mm Edwards Sapien 3 valve   3. Sufficient coronary to annulus distance, measuring 43mm to left main and 34mm to RCA   4. Optimum Fluoroscopic Angle for Delivery:  LAO 18 CAU 5   5. Coronary calcium score 202 (74th percentile)     Electronically Signed   By: Oswaldo Milian M.D.   On: 11/12/2021 22:08    Addended by Donato Heinz, MD on 11/12/2021 10:10 PM   Study Result  Narrative & Impression  EXAM: OVER-READ INTERPRETATION  CT CHEST   The following report is an over-read performed by radiologist Dr. Yetta Glassman of [facility], [place] on 11/12/2021. This over-read does not include interpretation of cardiac or coronary anatomy or pathology. The coronary calcium score/coronary CTA interpretation by the cardiologist is attached.   COMPARISON:  None.   FINDINGS: Extracardiac findings will be described separately under dictation for contemporaneously obtained CTA chest, abdomen and pelvis.   IMPRESSION: Please see separate dictation for contemporaneously obtained CTA chest, abdomen and pelvis dated 11/12/2021 for full description of relevant extracardiac findings.   Electronically Signed: By:  Yetta Glassman M.D. On: 11/12/2021 10:40       Narrative & Impression  CLINICAL DATA:  Preop evaluation for TAVR   EXAM: CT ANGIOGRAPHY CHEST, ABDOMEN AND PELVIS   TECHNIQUE: Non-contrast CT of the chest was initially obtained.   Multidetector CT imaging through the chest, abdomen and pelvis was performed using the standard protocol during bolus administration of intravenous contrast. Multiplanar reconstructed images and MIPs were obtained and reviewed to evaluate the vascular anatomy.   RADIATION DOSE REDUCTION: This exam was performed according to the departmental dose-optimization program which includes automated exposure control, adjustment of the mA and/or kV according to patient size and/or use of iterative reconstruction technique.   CONTRAST:  68mL OMNIPAQUE IOHEXOL 350 MG/ML SOLN  COMPARISON:  None.   FINDINGS: CTA CHEST FINDINGS   Cardiovascular: Mild cardiomegaly. No significant pericardial effusion/thickening. Aortic valve thickening and calcifications. Coronary artery calcifications of the LAD. Normal variant 4 vessel aortic arch with the left vertebral artery arising directly from the aorta. No significant stenosis of the arch vessels. Mild atherosclerotic disease of the thoracic aorta consisting of soft plaque. No central pulmonary emboli.   Mediastinum/Nodes: Enlarged and heterogeneous left thyroid lobe. Patulous esophagus. No pathologically enlarged axillary, mediastinal or hilar lymph nodes.   Lungs/Pleura: Central airways are patent. No consolidation, pleural effusion or pneumothorax.   Musculoskeletal:  No aggressive appearing focal osseous lesions.   CTA ABDOMEN AND PELVIS FINDINGS   Hepatobiliary: Normal liver with no liver mass. Normal gallbladder with no radiopaque cholelithiasis. No biliary ductal dilatation.   Pancreas: Normal, with no mass or duct dilation.   Spleen: Normal size. No mass.   Adrenals/Urinary Tract: Normal  adrenals. 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 No nephrolithiasis. Normal bladder.   Stomach/Bowel: Normal non-distended stomach. Normal caliber small bowel with no small bowel wall thickening. Diverticulosis. No large bowel wall thickening or pericolonic fat stranding.   Vascular/Lymphatic: Minimal calcified plaque of the right internal iliac artery otherwise, otherwise no evidence of aortoiliac atherosclerotic disease. Major branch vessels are normal in caliber with no significant stenosis. No pathologically enlarged lymph nodes in the abdomen or pelvis.   Reproductive: No adnexal masses.   Other: No pneumoperitoneum, ascites or focal fluid collection.   Musculoskeletal: No aggressive appearing focal osseous lesions. Prior total right hip replacement and posterior fusion of the lumbosacral spine.   VASCULAR MEASUREMENTS PERTINENT TO TAVR:   AORTA:   Minimal Aortic Diameter-16.2 mm   Severity of Aortic Calcification-none   RIGHT PELVIS:   Right Common Iliac Artery -   Minimal Diameter-11.5 mm   Tortuosity-none   Calcification-none   Right External Iliac Artery -   Minimal Diameter-6.8 mm   Tortuosity-moderate   Calcification-none   Right Common Femoral Artery -   Minimal Diameter-6.6 mm   Tortuosity-none   Calcification-minimal   LEFT PELVIS:   Left Common Iliac Artery -   Minimal Diameter-9.8 mm   Tortuosity-moderate   Calcification-none   Left External Iliac Artery -   Minimal Diameter-7.2 mm   Tortuosity-moderate   Calcification-none   Left Common Femoral Artery -   Minimal Diameter-7.6 mm   Tortuosity-none   Calcification-none   Review of the MIP images confirms the above findings.   IMPRESSION: Vascular:   1. Vascular findings and measurements pertinent to potential TAVR procedure, as detailed above. 2. Severe  thickening calcification of the aortic valve, compatible with reported clinical history of severe aortic stenosis. 3. Mild aortoiliac atherosclerotic disease. Coronary artery calcifications of the LAD.   Nonvascular:   1. Enlarged and heterogeneous left thyroid lobe. Recommend further evaluation with thyroid ultrasound. 2. Diverticulosis with no evidence of diverticulitis.     Electronically Signed   By: Yetta Glassman M.D.   On: 11/12/2021 12:03    Impression:  This 76 year old woman has stage D, severe, symptomatic aortic stenosis with NYHA class 2 symptoms of progressive exertional shortness of breath and fatigue consistent with chronic diastolic heart failure. I have personally reviewed her echo, cath and CTA studies. Her echo in 06/2021 showed a severely calcified aortic valve with thickened and immobile leaflets with a mean gradient of 47 mm Hg and  an AVA of 0.79 consistent with severe AS. Her cath showed no CAD with a mean gradient of 40 mm Hg. I agree that AVR is indicated for relief of her symptoms, prevention of progressive LV dysfunction and survival benefit. Given her age and reduced mobility I think TAVR would be the best option for her. Her gated cardiac CTA shows anatomy suitable for TAVR using a Sapien 3 valve. Her abdominal and pelvic CTA shows adequate pelvic vascular anatomy to allow transfemoral insertion.   The patient and her husband were counseled at length regarding treatment alternatives for management of severe symptomatic aortic stenosis. The risks and benefits of surgical intervention has been discussed in detail. Long-term prognosis with medical therapy was discussed. Alternative approaches such as conventional surgical aortic valve replacement, transcatheter aortic valve replacement, and palliative medical therapy were compared and contrasted at length. This discussion was placed in the context of the patient's own specific clinical presentation and past medical  history. All of their questions have been addressed.   Following the decision to proceed with transcatheter aortic valve replacement, a discussion was held regarding what types of management strategies would be attempted intraoperatively in the event of life-threatening complications, including whether or not the patient would be considered a candidate for the use of cardiopulmonary bypass and/or conversion to open sternotomy for attempted surgical intervention. I think she would be a candidate for emergent sternotomy to manage any intraoperative complications. The patient is aware of the fact that transient use of cardiopulmonary bypass may be necessary. The patient has been advised of a variety of complications that might develop including but not limited to risks of death, stroke, paravalvular leak, aortic dissection or other major vascular complications, aortic annulus rupture, device embolization, cardiac rupture or perforation, mitral regurgitation, acute myocardial infarction, arrhythmia, heart block or bradycardia requiring permanent pacemaker placement, congestive heart failure, respiratory failure, renal failure, pneumonia, infection, other late complications related to structural valve deterioration or migration, or other complications that might ultimately cause a temporary or permanent loss of functional independence or other long term morbidity. The patient provides full informed consent for the procedure as described and all questions were answered.      Plan:  She will be scheduled for transfemoral TAVR using a Sapien 3 valve on 12/29/2021.   I spent 60 minutes performing this consultation and > 50% of this time was spent face to face counseling and coordinating the care of this patient's severe aortic stenosis.    Gaye Pollack, MD 12/23/2021 11:21 AM

## 2021-12-24 NOTE — Progress Notes (Signed)
Surgical Instructions    Your procedure is scheduled on Tuesday, May 23rd, 2023.   Report to [facility] Main Entrance "A" at 05:30 A.M., then check in with the Admitting office.  Call this number if you have problems the morning of surgery:  [phone]   If you have any questions prior to your surgery date call [phone]: Open Monday-Friday 8am-4pm    Remember:  Do not eat or drink fter midnight the night before your surgery    Take these medicines the morning of surgery with A SIP OF WATER:   STOP on Thursday (5/18) taking any Aspirin (unless otherwise instructed by your surgeon), Aleve, Naproxen, Ibuprofen, Motrin, Advil, Goody's, BC's, all herbal medications, fish oil, and all non-prescription vitamins.     Continue taking all other medications without change through the day before surgery.  On the morning of surgery do not take any medications.  The day of surgery:          Do not wear jewelry or makeup Do not wear lotions, powders, perfumes, or deodorant. Do not shave 48 hours prior to surgery.   Do not bring valuables to the hospital. Do not wear nail polish, gel polish, artificial nails, or any other type of covering on natural nails (fingers and toes) If you have artificial nails or gel coating that need to be removed by a nail salon, please have this removed prior to surgery. Artificial nails or gel coating may interfere with anesthesia's ability to adequately monitor your vital signs.  [facility] is not responsible for any belongings or valuables. .   Do NOT Smoke (Tobacco/Vaping)  24 hours prior to your procedure  If you use a CPAP at night, you may bring your mask for your overnight stay.   Contacts, glasses, hearing aids, dentures or partials may not be worn into surgery, please bring cases for these belongings   For patients admitted to the hospital, discharge time will be determined by your treatment team.   Patients discharged the day of surgery will not  be allowed to drive home, and someone needs to stay with them for 24 hours.   SURGICAL WAITING ROOM VISITATION Patients having surgery or a procedure in a hospital may have two support people. Children under the age of 26 must have an adult with them who is not the patient. They may stay in the waiting area during the procedure and may switch out with other visitors. If the patient needs to stay at the hospital during part of their recovery, the visitor guidelines for inpatient rooms apply.  Please refer to the [facility] website for the visitor guidelines for Inpatients (after your surgery is over and you are in a regular room).    Special instructions:    Oral Hygiene is also important to reduce your risk of infection.  Remember - BRUSH YOUR TEETH THE MORNING OF SURGERY WITH YOUR REGULAR TOOTHPASTE   [facility]- Preparing For Surgery  Before surgery, you can play an important role. Because skin is not sterile, your skin needs to be as free of germs as possible. You can reduce the number of germs on your skin by washing with CHG (chlorahexidine gluconate) Soap before surgery.  CHG is an antiseptic cleaner which kills germs and bonds with the skin to continue killing germs even after washing.     Please do not use if you have an allergy to CHG or antibacterial soaps. If your skin becomes reddened/irritated stop using the CHG.  Do  not shave (including legs and underarms) for at least 48 hours prior to first CHG shower. It is OK to shave your face.  Please follow these instructions carefully.     Shower the NIGHT BEFORE SURGERY and the MORNING OF SURGERY with CHG Soap.   If you chose to wash your hair, wash your hair first as usual with your normal shampoo. After you shampoo, rinse your hair and body thoroughly to remove the shampoo.  Then [employer] and genitals (private parts) with your normal soap and rinse thoroughly to remove soap.  After that Use CHG Soap as you would any other  liquid soap. You can apply CHG directly to the skin and wash gently with a scrungie or a clean washcloth.   Apply the CHG Soap to your body ONLY FROM THE NECK DOWN.  Do not use on open wounds or open sores. Avoid contact with your eyes, ears, mouth and genitals (private parts). Wash Face and genitals (private parts)  with your normal soap.   Wash thoroughly, paying special attention to the area where your surgery will be performed.  Thoroughly rinse your body with warm water from the neck down.  DO NOT shower/wash with your normal soap after using and rinsing off the CHG Soap.  Pat yourself dry with a CLEAN TOWEL.  Wear CLEAN PAJAMAS to bed the night before surgery  Place CLEAN SHEETS on your bed the night before your surgery  DO NOT SLEEP WITH PETS.   Day of Surgery:  Take a shower with CHG soap. Wear Clean/Comfortable clothing the morning of surgery Do not apply any deodorants/lotions.   Remember to brush your teeth WITH YOUR REGULAR TOOTHPASTE.    If you received a COVID test during your pre-op visit, it is requested that you wear a mask when out in public, stay away from anyone that may not be feeling well, and notify your surgeon if you develop symptoms. If you have been in contact with anyone that has tested positive in the last 10 days, please notify your surgeon.    Please read over the following fact sheets that you were given.

## 2021-12-25 ENCOUNTER — Other Ambulatory Visit: Payer: Self-pay

## 2021-12-25 ENCOUNTER — Encounter (HOSPITAL_COMMUNITY)
Admission: RE | Admit: 2021-12-25 | Discharge: 2021-12-25 | Disposition: A | Discharge status: Home / Self Care | Payer: Medicare Other | Source: Ambulatory Visit | Attending: Cardiovascular Disease | Admitting: Cardiovascular Disease

## 2021-12-25 ENCOUNTER — Encounter (HOSPITAL_COMMUNITY): Payer: Self-pay

## 2021-12-25 VITALS — BP 146/70 | HR 63 | Temp 98.0°F | Resp 18 | Ht 62.0 in | Wt 180.8 lb

## 2021-12-25 DIAGNOSIS — Z20822 Contact with and (suspected) exposure to covid-19: Secondary | ICD-10-CM | POA: Insufficient documentation

## 2021-12-25 DIAGNOSIS — I350 Nonrheumatic aortic (valve) stenosis: Secondary | ICD-10-CM | POA: Insufficient documentation

## 2021-12-25 DIAGNOSIS — Z01818 Encounter for other preprocedural examination: Secondary | ICD-10-CM | POA: Diagnosis present

## 2021-12-25 LAB — COMPREHENSIVE METABOLIC PANEL
ALT: 18 U/L (ref 0–44)
AST: 30 U/L (ref 15–41)
Albumin: 4.1 g/dL (ref 3.5–5.0)
Alkaline Phosphatase: 45 U/L (ref 38–126)
Anion gap: 6 (ref 5–15)
BUN: 21 mg/dL (ref 8–23)
CO2: 26 mmol/L (ref 22–32)
Calcium: 9 mg/dL (ref 8.9–10.3)
Chloride: 104 mmol/L (ref 98–111)
Creatinine, Ser: 0.85 mg/dL (ref 0.44–1.00)
GFR, Estimated: >60 mL/min (ref >60)
Glucose, Bld: 102 mg/dL — ABNORMAL HIGH (ref 70–99)
Potassium: 4.3 mmol/L (ref 3.5–5.1)
Sodium: 136 mmol/L (ref 135–145)
Total Bilirubin: 0.6 mg/dL (ref 0.3–1.2)
Total Protein: 6.7 g/dL (ref 6.5–8.1)

## 2021-12-25 LAB — URINALYSIS, ROUTINE W REFLEX MICROSCOPIC
Bilirubin Urine: NEGATIVE
Glucose, UA: NEGATIVE mg/dL
Hgb urine dipstick: NEGATIVE
Ketones, ur: NEGATIVE mg/dL
Leukocytes,Ua: NEGATIVE
Nitrite: NEGATIVE
Protein, ur: NEGATIVE mg/dL
Specific Gravity, Urine: 1.008 (ref 1.005–1.030)
pH: 7 (ref 5.0–8.0)

## 2021-12-25 LAB — TYPE AND SCREEN
ABO/RH(D): A NEG
Antibody Screen: NEGATIVE

## 2021-12-25 LAB — CBC
HCT: 41.1 % (ref 36.0–46.0)
Hemoglobin: 13.6 g/dL (ref 12.0–15.0)
MCH: 31.5 pg (ref 26.0–34.0)
MCHC: 33.1 g/dL (ref 30.0–36.0)
MCV: 95.1 fL (ref 80.0–100.0)
Platelets: 194 10*3/uL (ref 150–400)
RBC: 4.32 MIL/uL (ref 3.87–5.11)
RDW: 13 % (ref 11.5–15.5)
WBC: 5.5 10*3/uL (ref 4.0–10.5)
nRBC: 0 % (ref 0.0–0.2)

## 2021-12-25 LAB — SARS CORONAVIRUS 2 (TAT 6-24 HRS): SARS Coronavirus 2: NEGATIVE

## 2021-12-25 LAB — PROTIME-INR
INR: 1 (ref 0.8–1.2)
Prothrombin Time: 13.4 seconds (ref 11.4–15.2)

## 2021-12-25 LAB — SURGICAL PCR SCREEN
MRSA, PCR: NEGATIVE
Staphylococcus aureus: NEGATIVE

## 2021-12-25 NOTE — Progress Notes (Signed)
PCP - Dr. Elias Else Cardiologist - Dr. Nanetta Batty  PPM/ICD - denies   Chest x-ray - 12/25/21 EKG - 12/25/21 Stress Test - denies ECHO - 06/23/21 Cardiac Cath - 10/12/21  Sleep Study - denies  DM- denies  ASA/Blood Thinner Instructions: n/a   ERAS Protcol - no, NPO   COVID TEST- 12/25/21   Anesthesia review: yes, cardiac hx  Patient denies shortness of breath, fever, cough and chest pain at PAT appointment   All instructions explained to the patient, with a verbal understanding of the material. Patient agrees to go over the instructions while at home for a better understanding. Patient also instructed to wear a mask in public after being tested for COVID-19. The opportunity to ask questions was provided.

## 2021-12-28 ENCOUNTER — Other Ambulatory Visit (HOSPITAL_COMMUNITY): Payer: Self-pay | Admitting: *Deleted

## 2021-12-28 MED ORDER — NOREPINEPHRINE 4 MG/250ML-% IV SOLN
0.0000 ug/min | INTRAVENOUS | Status: AC
  Administered 2021-12-29: 2 ug/min via INTRAVENOUS
  Filled 2021-12-28: qty 250

## 2021-12-28 MED ORDER — POTASSIUM CHLORIDE 2 MEQ/ML IV SOLN
80.0000 meq | INTRAVENOUS | Status: DC
Start: 2021-12-29 — End: 2021-12-29
  Filled 2021-12-28: qty 40

## 2021-12-28 MED ORDER — DEXMEDETOMIDINE HCL IN NACL 400 MCG/100ML IV SOLN
0.1000 ug/kg/h | INTRAVENOUS | Status: AC
  Administered 2021-12-29: 1 ug/kg/h via INTRAVENOUS
  Filled 2021-12-28: qty 100

## 2021-12-28 MED ORDER — HEPARIN 30,000 UNITS/1000 ML (OHS) CELLSAVER SOLUTION
Status: DC
Start: 2021-12-29 — End: 2021-12-29
  Filled 2021-12-28: qty 1000

## 2021-12-28 MED ORDER — MAGNESIUM SULFATE 50 % IJ SOLN
40.0000 meq | INTRAMUSCULAR | Status: DC
  Filled 2021-12-28: qty 9.85

## 2021-12-28 MED ORDER — VANCOMYCIN HCL IN DEXTROSE 1-5 GM/200ML-% IV SOLN
1000.0000 mg | INTRAVENOUS | Status: AC
  Administered 2021-12-29: 1 mg via INTRAVENOUS
  Filled 2021-12-28: qty 200

## 2021-12-28 NOTE — Anesthesia Preprocedure Evaluation (Signed)
Anesthesia Evaluation  Patient identified by MRN, date of birth, ID band Patient awake    Reviewed: Allergy & Precautions, NPO status , Patient's Chart, lab work & pertinent test results  History of Anesthesia Complications Negative for: history of anesthetic complications  Airway Mallampati: II  TM Distance: >3 FB Neck ROM: Full    Dental  (+) Dental Advisory Given   Pulmonary neg pulmonary ROS,    Pulmonary exam normal        Cardiovascular + Valvular Problems/Murmurs AS and AI  Rhythm:Regular Rate:Normal + Systolic murmurs  '22 TTE - EF 60 to 65%. Grade I diastolic dysfunction (impaired relaxation). Left atrial size was moderately dilated. Mild mitral valve regurgitation. Tricuspid valve regurgitation is mild to moderate. Aortic valve regurgitation is mild to moderate. Severe aortic valve  stenosis. Aortic valve mean gradient measures 46.9 mmHg. Aortic valve Vmax measures 4.61 m/s. Aortic valve acceleration time measures 116 msec.     Neuro/Psych negative neurological ROS  negative psych ROS   GI/Hepatic negative GI ROS, Neg liver ROS,   Endo/Other   Obesity   Renal/GU negative Renal ROS     Musculoskeletal  (+) Arthritis ,   Abdominal   Peds  Hematology negative hematology ROS (+)   Anesthesia Other Findings   Reproductive/Obstetrics                            Anesthesia Physical Anesthesia Plan  ASA: 4  Anesthesia Plan: MAC   Post-op Pain Management: Tylenol PO (pre-op)*   Induction:   PONV Risk Score and Plan: 2 and Propofol infusion and Treatment may vary due to age or medical condition  Airway Management Planned: Natural Airway and Simple Face Mask  Additional Equipment: Arterial line  Intra-op Plan:   Post-operative Plan:   Informed Consent: I have reviewed the patients History and Physical, chart, labs and discussed the procedure including the risks, benefits and  alternatives for the proposed anesthesia with the patient or authorized representative who has indicated his/her understanding and acceptance.       Plan Discussed with: CRNA and Anesthesiologist  Anesthesia Plan Comments:        Anesthesia Quick Evaluation

## 2021-12-28 NOTE — H&P (Signed)
[address]       [place] [address]             [phone]      Cardiothoracic Surgery Admission History and Physical   PCP is Maury Dus, MD Referring Provider is Lauree Chandler, MD Primary Cardiologist is Quay Burow, MD   Reason for admission:  Severe aortic stenosis     HPI:   The patient is a 76 year old woman with arthritis, glaucoma and severe aortic stenosis referred for consideration of AVR. An echo in 06/2021 showed a mean gradient across the aortic valve of 47 mm Hg with an AVA of 0.79 cm2 with mild to moderate AI. Cardiac cath showed no CAD with a mean gradient of 40 mm Hg.    She lives with her husband. She reports progressive exertional shortness of breath and fatigue with moderate exertion. She has to stop to rest going up stairs and can't walk up hills. She has had some lower extremity edema that resolves overnight. She denies any chest discomfort or dizziness.       Past Medical History:  Diagnosis Date   Arthritis     Complication of anesthesia     Diverticulosis     Glaucoma     Severe aortic stenosis             Past Surgical History:  Procedure Laterality Date   ABDOMINAL HYSTERECTOMY   84   BACK SURGERY   04,06   DIAGNOSTIC LAPAROSCOPY   77    endometriosis   JOINT REPLACEMENT Right 08,11    [email or web address]   RIGHT/LEFT HEART CATH AND CORONARY ANGIOGRAPHY N/A 10/12/2021    Procedure: RIGHT/LEFT HEART CATH AND CORONARY ANGIOGRAPHY;  Surgeon: Lorretta Harp, MD;  Location: [facility];  Service: Cardiovascular;  Laterality: N/A;   TONSILLECTOMY   87   TOTAL KNEE ARTHROPLASTY Left 08/13/2013    Procedure: LEFT TOTAL KNEE ARTHROPLASTY;  Surgeon: Vickey Huger, MD;  Location: [facility];  Service: Orthopedics;  Laterality: Left;           Family History  Problem Relation Age of Onset   Hypertension Mother     Peripheral Artery Disease Mother     Atrial fibrillation Father        Social History          Socioeconomic History   Marital status: Married      Spouse name: Not on file   Number of children: 1   Years of education: Not on file   Highest education level: Not on file  Occupational History   Occupation: Retired nurse-pediatrics  Tobacco Use   Smoking status: Never   Smokeless tobacco: Never  Substance and Sexual Activity   Alcohol use: No   Drug use: No   Sexual activity: Not on file  Other Topics Concern   Not on file  Social History Narrative   Not on file    Social Determinants of Health    Financial Resource Strain: Not on file  Food Insecurity: Not on file  Transportation Needs: Not on file  Physical Activity: Not on file  Stress: Not on file  Social Connections: Not on file  Intimate Partner Violence: Not on file             Prior to Admission medications   Medication Sig Start Date End Date Taking? Authorizing Provider  acetaminophen (TYLENOL) 500 MG tablet Take 500 mg by mouth every  6 (six) hours as needed for moderate pain.     Yes [provider]  acyclovir (ZOVIRAX) 400 MG tablet Take 400 mg by mouth 2 (two) times daily as needed (fever blisters). 09/03/21   Yes [provider]  brimonidine (ALPHAGAN P) 0.1 % SOLN Place 1 drop into both eyes 2 (two) times daily.     Yes [provider]  Calcium Carb-Cholecalciferol (CALCIUM 600 + D PO) Take 1 tablet by mouth 2 (two) times daily.     Yes [provider]  cholecalciferol (VITAMIN D3) 25 MCG (1000 UNIT) tablet Take 1,000 Units by mouth daily. Midday     Yes [provider]  clindamycin (CLEOCIN) 150 MG capsule Take 600 mg by mouth See admin instructions. Take 1 hour before dental procedures     Yes [provider]  docusate sodium (COLACE) 100 MG capsule Take 100 mg by mouth 2 (two) times daily.     Yes [provider]  dorzolamide-timolol (COSOPT) 22.3-6.8 MG/ML ophthalmic solution Place 1 drop into both eyes 2 (two) times daily.     Yes  [provider]  famotidine (PEPCID) 20 MG tablet Take 20 mg by mouth daily as needed for indigestion.     Yes [provider]  Flaxseed, Linseed, (FLAX SEED OIL) 1000 MG CAPS Take 1,000 mg by mouth daily.     Yes [provider]  gabapentin (NEURONTIN) 300 MG capsule Take 300 mg by mouth 3 (three) times daily as needed (pain).     Yes [provider]  Glucosamine-Chondroit-Vit C-Mn (GLUCOSAMINE 1500 COMPLEX PO) Take 1 tablet by mouth 2 (two) times daily.     Yes [provider]  guaiFENesin 200 MG tablet Take 100 mg by mouth every 8 (eight) hours as needed for cough or to loosen phlegm. [place] Brand Mucinex     Yes [provider]  latanoprost (XALATAN) 0.005 % ophthalmic solution Place 1 drop into both eyes at bedtime. 09/03/21   Yes [provider]  Lidocaine HCl 4 % CREA Apply 1 application topically daily as needed (pain).     Yes [provider]  Liniments (SALONPAS EX) Place 1 patch onto the skin at bedtime as needed (for pain). Pain patch     Yes [provider]  meclizine (ANTIVERT) 25 MG tablet Take 25 mg by mouth 3 (three) times daily as needed (for motion sickness).     Yes [provider]  Multiple Vitamin (MULTIVITAMIN WITH MINERALS) TABS tablet Take 1 tablet by mouth daily.     Yes [provider]  naproxen sodium (ALEVE) 220 MG tablet Take 220 mg by mouth daily as needed (pain).     Yes [provider]  polyethylene glycol (MIRALAX / GLYCOLAX) packet Take 17 g by mouth daily.     Yes [provider]  Propylene Glycol (SYSTANE COMPLETE) 0.6 % SOLN Place 1 drop into both eyes as needed (dry eyes).     Yes [provider]  Psyllium (METAMUCIL PO) Take 1 capsule by mouth 2 (two) times daily.     Yes [provider]  raloxifene (EVISTA) 60 MG tablet Take 60 mg by mouth daily. 05/14/21   Yes [provider]  SODIUM FLUORIDE 5000 PPM 1.1 % PSTE Place  1 application onto teeth at bedtime. 05/25/21   Yes [provider]            Current Outpatient Medications  Medication Sig Dispense Refill   acetaminophen (  TYLENOL) 500 MG tablet Take 500 mg by mouth every 6 (six) hours as needed for moderate pain.       acyclovir (ZOVIRAX) 400 MG tablet Take 400 mg by mouth 2 (two) times daily as needed (fever blisters).       brimonidine (ALPHAGAN P) 0.1 % SOLN Place 1 drop into both eyes 2 (two) times daily.       Calcium Carb-Cholecalciferol (CALCIUM 600 + D PO) Take 1 tablet by mouth 2 (two) times daily.       cholecalciferol (VITAMIN D3) 25 MCG (1000 UNIT) tablet Take 1,000 Units by mouth daily. Midday       clindamycin (CLEOCIN) 150 MG capsule Take 600 mg by mouth See admin instructions. Take 1 hour before dental procedures       docusate sodium (COLACE) 100 MG capsule Take 100 mg by mouth 2 (two) times daily.       dorzolamide-timolol (COSOPT) 22.3-6.8 MG/ML ophthalmic solution Place 1 drop into both eyes 2 (two) times daily.       famotidine (PEPCID) 20 MG tablet Take 20 mg by mouth daily as needed for indigestion.       Flaxseed, Linseed, (FLAX SEED OIL) 1000 MG CAPS Take 1,000 mg by mouth daily.       gabapentin (NEURONTIN) 300 MG capsule Take 300 mg by mouth 3 (three) times daily as needed (pain).       Glucosamine-Chondroit-Vit C-Mn (GLUCOSAMINE 1500 COMPLEX PO) Take 1 tablet by mouth 2 (two) times daily.       guaiFENesin 200 MG tablet Take 100 mg by mouth every 8 (eight) hours as needed for cough or to loosen phlegm. [place] Brand Mucinex       latanoprost (XALATAN) 0.005 % ophthalmic solution Place 1 drop into both eyes at bedtime.       Lidocaine HCl 4 % CREA Apply 1 application topically daily as needed (pain).       Liniments (SALONPAS EX) Place 1 patch onto the skin at bedtime as needed (for pain). Pain patch       meclizine (ANTIVERT) 25 MG tablet Take 25 mg by mouth 3 (three) times daily as needed (for motion sickness).        Multiple Vitamin (MULTIVITAMIN WITH MINERALS) TABS tablet Take 1 tablet by mouth daily.       naproxen sodium (ALEVE) 220 MG tablet Take 220 mg by mouth daily as needed (pain).       polyethylene glycol (MIRALAX / GLYCOLAX) packet Take 17 g by mouth daily.       Propylene Glycol (SYSTANE COMPLETE) 0.6 % SOLN Place 1 drop into both eyes as needed (dry eyes).       Psyllium (METAMUCIL PO) Take 1 capsule by mouth 2 (two) times daily.       raloxifene (EVISTA) 60 MG tablet Take 60 mg by mouth daily.       SODIUM FLUORIDE 5000 PPM 1.1 % PSTE Place 1 application onto teeth at bedtime.        No current facility-administered medications for this visit.          Allergies  Allergen Reactions   Codeine Nausea Only   Keflex [Cephalexin] Rash          Review of Systems:               General:                      normal  appetite, + decreased energy, no weight gain, no weight loss, no fever             Cardiac:                       no chest pain with exertion, no chest pain at rest, + SOB with moderate exertion, no resting SOB, no PND, no orthopnea, no palpitations, no arrhythmia, no atrial fibrillation, + LE edema, no dizzy spells, no syncope             Respiratory:                 + exertional shortness of breath, no home oxygen, no productive cough, no dry cough, no bronchitis, no wheezing, no hemoptysis, no asthma, no pain with inspiration or cough, no sleep apnea, no CPAP at night             GI:                               no difficulty swallowing, no reflux, no frequent heartburn, no hiatal hernia, no abdominal pain, + constipation, no diarrhea, no hematochezia, no hematemesis, no melena             GU:                              no dysuria,  no frequency, no urinary tract infection, no hematuria, no kidney stones, no kidney disease             Vascular:                     no pain suggestive of claudication, no pain in feet, no leg cramps, no varicose veins, no DVT, no non-healing foot  ulcer             Neuro:                         no stroke, no TIA's, no seizures, no headaches, no temporary blindness one eye,  no slurred speech, no peripheral neuropathy, no chronic pain, + instability of gait, no memory/cognitive dysfunction             Musculoskeletal:         + arthritis, no joint swelling, no myalgias, + difficulty walking, + decreased mobility and uses a cane for balance             Skin:                            no rash, no itching, no skin infections, no pressure sores or ulcerations             Psych:                         no anxiety, no depression, no nervousness, no unusual recent stress             Eyes:                           no blurry vision, no floaters, no recent vision changes, + wears glasses              ENT:  no hearing loss, no loose or painful teeth, no dentures, last saw dentist fall 2022             Hematologic:               + easy bruising, no abnormal bleeding, no clotting disorder, no frequent epistaxis             Endocrine:                   no diabetes, does not check CBG's at home                            Physical Exam:               BP (!) 149/85 (BP Location: Left Arm, Patient Position: Sitting)   Pulse 72   Resp 20   Ht 5\' 2"  (1.575 m)   Wt 186 lb (84.4 kg)   SpO2 97% Comment: RA  BMI 34.02 kg/m              General:                      well-appearing             HEENT:                       Unremarkable, NCAT, PERLA, EOMI             Neck:                           no JVD, no bruits, no adenopathy              Chest:                          clear to auscultation, symmetrical breath sounds, no wheezes, no rhonchi              CV:                              RRR, 3/6 systolic murmur RSB, no diastolic murmur             Abdomen:                    soft, non-tender, no masses or organomegaly             Extremities:                 warm, well-perfused, pulses palpable at ankle, no lower extremity  edema             Rectal/GU                   Deferred             Neuro:                         Grossly non-focal and symmetrical throughout             Skin:                            Clean and dry, no rashes, no breakdown   Diagnostic  Tests:     ECHOCARDIOGRAM REPORT         Patient Name:   [name] Date of Exam: 06/23/2021  Medical Rec #:  [id number]      Height:       62.0 in  Accession #:    [id number]     Weight:       180.8 lb  Date of Birth:  11-27-1945      BSA:          1.831 m  Patient Age:    32 years       BP:           119/73 mmHg  Patient Gender: F              HR:           68 bpm.  Exam Location:  [address]   Procedure: 2D Echo, Cardiac Doppler and Color Doppler   Indications:    I35.0 Aortic Stenosis     History:        Patient has no prior history of Echocardiogram  examinations.     Sonographer:    Wilford Sports Rodgers-Jones RDCS  Referring Phys: [place]     1. Left ventricular ejection fraction, by estimation, is 60 to 65%. The  left ventricle has normal function. The left ventricle has no regional  wall motion abnormalities. Left ventricular diastolic parameters are  consistent with Grade I diastolic  dysfunction (impaired relaxation).   2. Right ventricular systolic function is normal. The right ventricular  size is normal. There is normal pulmonary artery systolic pressure.   3. Left atrial size was moderately dilated.   4. The mitral valve is normal in structure. Mild mitral valve  regurgitation. No evidence of mitral stenosis.   5. Tricuspid valve regurgitation is mild to moderate.   6. The aortic valve has an indeterminant number of cusps. There is severe  calcifcation of the aortic valve. There is severe thickening of the aortic  valve. Aortic valve regurgitation is mild to moderate. Severe aortic valve  stenosis. Aortic valve mean  gradient measures 46.9 mmHg. Aortic valve Vmax measures 4.61 m/s. Aortic   valve acceleration time measures 116 msec.   7. The inferior vena cava is dilated in size with >50% respiratory  variability, suggesting right atrial pressure of 8 mmHg.   FINDINGS   Left Ventricle: Left ventricular ejection fraction, by estimation, is 60  to 65%. The left ventricle has normal function. The left ventricle has no  regional wall motion abnormalities. The left ventricular internal cavity  size was normal in size. There is   no left ventricular hypertrophy. Left ventricular diastolic parameters  are consistent with Grade I diastolic dysfunction (impaired relaxation).  Normal left ventricular filling pressure.   Right Ventricle: The right ventricular size is normal. No increase in  right ventricular wall thickness. Right ventricular systolic function is  normal. There is normal pulmonary artery systolic pressure. The tricuspid  regurgitant velocity is 2.57 m/s, and   with an assumed right atrial pressure of 8 mmHg, the estimated right  ventricular systolic pressure is 99991111 mmHg.   Left Atrium: Left atrial size was moderately dilated.   Right Atrium: Right atrial size was normal in size.   Pericardium: There is no evidence of pericardial effusion.   Mitral Valve: The mitral valve is normal in structure. There is mild  thickening of the mitral valve leaflet(s). Mild mitral annular  calcification. Mild mitral valve regurgitation. No evidence of mitral  valve stenosis.   Tricuspid Valve: The tricuspid valve is normal in structure. Tricuspid  valve regurgitation is mild to moderate. No evidence of tricuspid  stenosis.   Aortic Valve: The aortic valve has an indeterminant number of cusps. There  is severe calcifcation of the aortic valve. There is severe thickening of  the aortic valve. Aortic valve regurgitation is mild to moderate. Aortic  regurgitation PHT measures 487  msec. Severe aortic stenosis is present. Aortic valve mean gradient  measures 46.9 mmHg. Aortic  valve peak gradient measures 84.8 mmHg. Aortic  valve area, by VTI measures 0.79 cm.   Pulmonic Valve: The pulmonic valve was normal in structure. Pulmonic valve  regurgitation is mild. No evidence of pulmonic stenosis.   Aorta: The aortic root is normal in size and structure.   Venous: The inferior vena cava is dilated in size with greater than 50%  respiratory variability, suggesting right atrial pressure of 8 mmHg.   IAS/Shunts: No atrial level shunt detected by color flow Doppler.      LEFT VENTRICLE  PLAX 2D  LVIDd:         4.80 cm   Diastology  LVIDs:         3.00 cm   LV e' medial:    8.55 cm/s  LV PW:         1.00 cm   LV E/e' medial:  6.7  LV IVS:        0.60 cm   LV e' lateral:   9.48 cm/s  LVOT diam:     1.90 cm   LV E/e' lateral: 6.0  LV SV:         87  LV SV Index:   48  LVOT Area:     2.84 cm      RIGHT VENTRICLE  RV Basal diam:  3.10 cm  RV S prime:     16.00 cm/s  TAPSE (M-mode): 2.8 cm   LEFT ATRIUM             Index        RIGHT ATRIUM           Index  LA diam:        4.70 cm 2.57 cm/m   RA Area:     17.90 cm  LA Vol (A2C):   48.5 ml 26.48 ml/m  RA Volume:   49.20 ml  26.86 ml/m  LA Vol (A4C):   52.9 ml 28.88 ml/m  LA Biplane Vol: 50.5 ml 27.57 ml/m   AORTIC VALVE  AV Area (Vmax):    0.83 cm  AV Area (Vmean):   0.86 cm  AV Area (VTI):     0.79 cm  AV Vmax:           460.51 cm/s  AV Vmean:          299.779 cm/s  AV VTI:            1.108 m  AV Peak Grad:      84.8 mmHg  AV Mean Grad:      46.9 mmHg  LVOT Vmax:         135.00 cm/s  LVOT Vmean:        90.850 cm/s  LVOT VTI:          0.308 m  LVOT/AV VTI ratio: 0.28  AI PHT:            487  msec     AORTA  Ao Root diam: 2.90 cm  Ao Asc diam:  3.60 cm   MITRAL VALVE               TRICUSPID VALVE  MV Area (PHT): 2.80 cm    TR Peak grad:   26.4 mmHg  MV Decel Time: 271 msec    TR Vmax:        257.00 cm/s  MV E velocity: 57.10 cm/s  MV A velocity: 87.50 cm/s  SHUNTS  MV E/A ratio:  0.65         Systemic VTI:  0.31 m                             Systemic Diam: 1.90 cm   Ruth Gobble Croitoru MD  Electronically signed by Sanda Klein MD  Signature Date/Time: 06/23/2021/4:32:26 PM         Final       Physicians   Panel Physicians Referring Physician Case Authorizing Physician  Lorretta Harp, MD (Primary)        Procedures   RIGHT/LEFT HEART CATH AND CORONARY ANGIOGRAPHY    Conclusion   [name] is a 76 y.o. female      [id number] LOCATION:  FACILITY: [facility]  PHYSICIAN: Quay Burow, M.D. Feb 14, 1946     DATE OF PROCEDURE:  10/12/2021   DATE OF DISCHARGE:       CARDIAC CATHETERIZATION        History obtained from chart review.[name] is a 76 y.o.  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  She is referred by her PCP, Dr. Antony Contras, for evaluation of severe aortic stenosis.  She apparently was in a RSV trial and had one of the physicians auscultate a murmur.  This was confirmed by her PCP who ordered a 2D echo at their practice which was notable for severe aortic stenosis.  She has minimal symptoms except for mild dyspnea when she walks up 2 sets of stairs in her 3 level townhome.  She denies chest pain or dizziness.  She has no other cardiac risk factors.  She is never had a heart attack or stroke.  There is no family history for heart disease.  Since I saw her 5 months ago she has noticed increasing dyspnea with less exertion as well as more fatigue.  She denies chest pain.  We discussed performing right left heart cath to define her anatomy and physiology in anticipation of aortic valve replacement.       HEMODYNAMICS: Right heart cath  1: Right atrial pressure-9/6 2: Right ventricular pressure-31/0 3: Pulmonary artery pressure-31/8, mean 17 4: Pulmonary wedge  pressure-A-wave 14, V wave 11, mean 11 5: Aortic valve gradient 40.3 mm 6: Aortic valve area-1.02 cm with an index of 0.55 cm/m 7: Cardiac output-5.8 L/min with an index of 3.2 L/min/m 8: LVEDP-12     IMPRESSION: Ms. Rickner has clean coronary arteries with severe aortic stenosis.  She has progressive symptoms.  She will be a good candidate for TAVR.  I am going to refer her to the multidisciplinary structural clinic for further evaluation.  The right radial sheath was removed and a TR band was placed on the right wrist to achieve patent hemostasis.  The right antecubital sheath was removed as well.  The patient left the lab in stable condition.  She will be discharged home later this morning, and I will see her back in the office in 1 to 2 weeks for follow-up.   Quay Burow. MD, [facility] 10/12/2021 8:08 AM         Procedural Details   Technical Details PROCEDURE DESCRIPTION:   The patient was brought to the second floor [facility] cath lab in the postabsorptive state.  She was premedicated with IV Versed and fentanyl.  Her right wrist and antecubital fossa Where prepped and shaved in usual sterile fashion. Xylocaine 1% was used for local anesthesia. A 6 French sheath was inserted into the right radial artery using standard Seldinger technique.  A 5 French sheath was inserted into the right antecubital fossa.  A 5 French balloontipped Swan-Ganz catheter was then advanced through the right heart chambers obtaining sequential pressures and pulmonary artery blood samples for the determination of Fick cardiac output.  5 [place] TIG and right Judkins catheters were used for selective coronary angiography and obtain left heart pressures.  Isovue dye was used for the entirety of the case (45 cc of contrast total to patient).  Retrograde aortic, left ventricular and pullback pressures were recorded.  The patient received 4000 units of heparin intravenously.  Radial cocktail was administered via  the SideArm sheath. Estimated blood loss <50 mL.   During this procedure medications were administered to achieve and maintain moderate conscious sedation while the patient's heart rate, blood pressure, and oxygen saturation were continuously monitored and I was present face-to-face 100% of this time.    Medications (Filter: Administrations occurring from 0721 to 0805 on 10/12/21) Heparin (Porcine) in NaCl 1000-0.9 UT/500ML-% SOLN (mL) Total volume:  1,000 mL Date/Time Rate/Dose/Volume Action    10/12/21 0729 500 mL Given    0729 500 mL Given      midazolam (VERSED) injection (mg) Total dose:  1 mg Date/Time Rate/Dose/Volume Action    10/12/21 0738 1 mg Given      fentaNYL (SUBLIMAZE) injection (mcg) Total dose:  25 mcg Date/Time Rate/Dose/Volume Action    10/12/21 0739 25 mcg Given      lidocaine (PF) (XYLOCAINE) 1 % injection (mL) Total volume:  7 mL Date/Time Rate/Dose/Volume Action    10/12/21 0739 5 mL Given    0745 2 mL Given      Radial Cocktail (Verapamil 2.5 mg, NTG, Lidocaine) (mL) Total volume:  10 mL Date/Time Rate/Dose/Volume Action    10/12/21 0747 10 mL Given      heparin sodium (porcine) injection (Units) Total dose:  4,000 Units Date/Time Rate/Dose/Volume Action    10/12/21 0749 4,000 Units Given      iohexol (OMNIPAQUE) 350 MG/ML injection (mL) Total volume:  45 mL Date/Time Rate/Dose/Volume Action    10/12/21 0759 45 mL Given      Sedation Time   Sedation Time Physician-1: 19 minutes 58 seconds Contrast   Medication Name Total Dose  iohexol (OMNIPAQUE) 350 MG/ML injection 45 mL    Radiation/Fluoro   Fluoro time: 3.9 (min) DAP: [id number] (mGycm2) Cumulative Air Kerma: 123456 (mGy) Complications      Complications documented before study signed (10/12/2021  [id number] AM)     No complications were associated with this study.  Documented by Littie Deeds, RT - 10/12/2021  8:06 AM      Coronary Findings   Diagnostic Dominance: Right No  diagnostic findings have been documented. Intervention  No interventions have been documented. Coronary Diagrams   Diagnostic Dominance: Right Intervention   Implants      No implant documentation for this case.    Syngo Images    Show images for CARDIAC CATHETERIZATION Images on Long Term Storage    Show images for Yaelle, Fancy to Procedure Log   Procedure Log    Hemo Data   Flowsheet Row Most Recent Value  Fick Cardiac Output 5.84 L/min  Fick Cardiac Output Index 3.16 (L/min)/BSA  Aortic Mean Gradient 40.28 mmHg  Aortic Peak Gradient 40.8 mmHg  Aortic Valve Area 1.02  Aortic Value Area Index 0.55 cm2/BSA  RA A Wave 9 mmHg  RA V Wave 6 mmHg  RA Mean 4 mmHg  RV Systolic Pressure 31 mmHg  RV Diastolic Pressure -4 mmHg  RV EDP 12 mmHg  PA Systolic Pressure 31 mmHg  PA Diastolic Pressure 8 mmHg  PA Mean 17 mmHg  PW A Wave 14 mmHg  PW V Wave 11 mmHg  PW Mean 11 mmHg  AO Systolic Pressure 99 mmHg  AO Diastolic Pressure 52 mmHg  AO Mean 71 mmHg  LV Systolic Pressure 123456 mmHg  LV Diastolic Pressure 1 mmHg  LV EDP 12 mmHg  AOp Systolic Pressure 0000000 mmHg  AOp Diastolic Pressure 55 mmHg  AOp Mean Pressure 78 mmHg  LVp Systolic Pressure [id number] mmHg  LVp Diastolic Pressure 0 mmHg  LVp EDP Pressure 12 mmHg  QP/QS 1  TPVR Index 5.38 HRUI  TSVR Index 28.17 HRUI  PVR SVR Ratio 0.07  TPVR/TSVR Ratio 0.19      Addendum   ADDENDUM REPORT: 11/12/2021 22:08   CLINICAL DATA:  44F with severe aortic stenosis being evaluated for a TAVR procedure.   EXAM: Cardiac TAVR CT   TECHNIQUE: The patient was scanned on a [employer]. A 120 kV retrospective scan was triggered in the descending thoracic aorta at 111 HU's. Gantry rotation speed was 250 msecs and collimation was .6 mm. No beta blockade or nitro were given. The 3D data set was reconstructed in 5% intervals of the R-R cycle. Systolic and diastolic phases were analyzed on a dedicated work  station using MPR, MIP and VRT modes. The patient received 80 cc of contrast.   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 and non coronary cusps   Aortic valve calcium score: 2643   Aortic annulus:   Diameter: 48mm x 96mm   Perimeter: 84mm   Area: 374 mm^2   Calcifications: Severe calcification adjacent to noncoronary cusp   Coronary height: Min Left - 63mm, Max Left - 4mm; Min Right - 39mm   Sinotubular height: Left cusp - 58mm; Right cusp - 66mm; Noncoronary cusp - 3mm   LVOT (as measured 3 mm below the annulus):   Diameter: 81mm x 97mm   Area:  377mm^2   Calcifications: Calcification inferior to noncoronary cusp   Aortic sinus width: Left cusp - 51mm; Right cusp - 42mm; Noncoronary cusp - 66mm   Sinotubular junction width: 51mm x 30mm   Optimum Fluoroscopic Angle for Delivery: LAO 18 CAU 5   Cardiac:   Right atrium: Moderately enlarged   Right ventricle: Moderately dilated   Pulmonary arteries: Normal size   Pulmonary veins: Normal configuration   Left atrium: Moderately enlarged   Left ventricle: Mildly dilated   Pericardium: Normal thickness   Coronary arteries: Coronary calcium score 202 (74th percentile)   IMPRESSION:  1. 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 Severely calcified (AV calcium score 2643)   2. Aortic annulus measures 97mm x 56mm in diameter with perimeter 78mm and area 374 mm^2. Severe annular calcification adjacent to noncoronary cusp extending into LVOT. Annular measurement suitable for delivery of 23 mm Edwards Sapien 3 valve   3. Sufficient coronary to annulus distance, measuring 68mm to left main and 8mm to RCA   4. Optimum Fluoroscopic Angle for Delivery:  LAO 18 CAU 5   5. Coronary calcium score 202 (74th percentile)     Electronically Signed   By: Oswaldo Milian  M.D.   On: 11/12/2021 22:08    Addended by Donato Heinz, MD on 11/12/2021 10:10 PM    Study Result   Narrative & Impression  EXAM: OVER-READ INTERPRETATION  CT CHEST   The following report is an over-read performed by radiologist Dr. Yetta Glassman of [facility], [place] on 11/12/2021. This over-read does not include interpretation of cardiac or coronary anatomy or pathology. The coronary calcium score/coronary CTA interpretation by the cardiologist is attached.   COMPARISON:  None.   FINDINGS: Extracardiac findings will be described separately under dictation for contemporaneously obtained CTA chest, abdomen and pelvis.   IMPRESSION: Please see separate dictation for contemporaneously obtained CTA chest, abdomen and pelvis dated 11/12/2021 for full description of relevant extracardiac findings.   Electronically Signed: By: Yetta Glassman M.D. On: 11/12/2021 10:40          Narrative & Impression  CLINICAL DATA:  Preop evaluation for TAVR   EXAM: CT ANGIOGRAPHY CHEST, ABDOMEN AND PELVIS   TECHNIQUE: Non-contrast CT of the chest was initially obtained.   Multidetector CT imaging through the chest, abdomen and pelvis was performed using the standard protocol during bolus administration of intravenous contrast. Multiplanar reconstructed images and MIPs were obtained and reviewed to evaluate the vascular anatomy.   RADIATION DOSE REDUCTION: This exam was performed according to the departmental dose-optimization program which includes automated exposure control, adjustment of the mA and/or kV according to patient size and/or use of iterative reconstruction technique.   CONTRAST:  12mL OMNIPAQUE IOHEXOL 350 MG/ML SOLN   COMPARISON:  None.   FINDINGS: CTA CHEST FINDINGS   Cardiovascular: Mild cardiomegaly. No significant pericardial effusion/thickening. Aortic valve thickening and calcifications. Coronary artery calcifications of the LAD. Normal  variant 4 vessel aortic arch with the left vertebral artery arising directly from the aorta. No significant stenosis of the arch vessels. Mild atherosclerotic disease of the thoracic aorta consisting of soft plaque. No central pulmonary emboli.   Mediastinum/Nodes: Enlarged and heterogeneous left thyroid lobe. Patulous esophagus. No pathologically enlarged axillary, mediastinal or hilar lymph nodes.   Lungs/Pleura: Central airways are patent. No consolidation, pleural effusion or pneumothorax.   Musculoskeletal:  No aggressive appearing focal osseous lesions.   CTA ABDOMEN AND PELVIS FINDINGS   Hepatobiliary: Normal liver with no liver mass. Normal gallbladder with no radiopaque cholelithiasis. No biliary ductal dilatation.   Pancreas: Normal, with no mass or duct dilation.   Spleen: Normal size. No mass.   Adrenals/Urinary Tract: Normal adrenals. 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 No nephrolithiasis. Normal bladder.   Stomach/Bowel: Normal non-distended stomach. Normal caliber small bowel with no small bowel wall thickening. Diverticulosis. No large bowel wall thickening or pericolonic fat stranding.   Vascular/Lymphatic: Minimal calcified plaque of the right internal  iliac artery otherwise, otherwise no evidence of aortoiliac atherosclerotic disease. Major branch vessels are normal in caliber with no significant stenosis. No pathologically enlarged lymph nodes in the abdomen or pelvis.   Reproductive: No adnexal masses.   Other: No pneumoperitoneum, ascites or focal fluid collection.   Musculoskeletal: No aggressive appearing focal osseous lesions. Prior total right hip replacement and posterior fusion of the lumbosacral spine.   VASCULAR MEASUREMENTS PERTINENT TO TAVR:   AORTA:   Minimal Aortic Diameter-16.2 mm   Severity of Aortic Calcification-none    RIGHT PELVIS:   Right Common Iliac Artery -   Minimal Diameter-11.5 mm   Tortuosity-none   Calcification-none   Right External Iliac Artery -   Minimal Diameter-6.8 mm   Tortuosity-moderate   Calcification-none   Right Common Femoral Artery -   Minimal Diameter-6.6 mm   Tortuosity-none   Calcification-minimal   LEFT PELVIS:   Left Common Iliac Artery -   Minimal Diameter-9.8 mm   Tortuosity-moderate   Calcification-none   Left External Iliac Artery -   Minimal Diameter-7.2 mm   Tortuosity-moderate   Calcification-none   Left Common Femoral Artery -   Minimal Diameter-7.6 mm   Tortuosity-none   Calcification-none   Review of the MIP images confirms the above findings.   IMPRESSION: Vascular:   1. Vascular findings and measurements pertinent to potential TAVR procedure, as detailed above. 2. Severe thickening calcification of the aortic valve, compatible with reported clinical history of severe aortic stenosis. 3. Mild aortoiliac atherosclerotic disease. Coronary artery calcifications of the LAD.   Nonvascular:   1. Enlarged and heterogeneous left thyroid lobe. Recommend further evaluation with thyroid ultrasound. 2. Diverticulosis with no evidence of diverticulitis.     Electronically Signed   By: Yetta Glassman M.D.   On: 11/12/2021 12:03      Impression:   This 76 year old woman has stage D, severe, symptomatic aortic stenosis with NYHA class 2 symptoms of progressive exertional shortness of breath and fatigue consistent with chronic diastolic heart failure. I have personally reviewed her echo, cath and CTA studies. Her echo in 06/2021 showed a severely calcified aortic valve with thickened and immobile leaflets with a mean gradient of 47 mm Hg and an AVA of 0.79 consistent with severe AS. Her cath showed no CAD with a mean gradient of 40 mm Hg. I agree that AVR is indicated for relief of her symptoms, prevention of progressive LV  dysfunction and survival benefit. Given her age and reduced mobility I think TAVR would be the best option for her. Her gated cardiac CTA shows anatomy suitable for TAVR using a Sapien 3 valve. Her abdominal and pelvic CTA shows adequate pelvic vascular anatomy to allow transfemoral insertion.    The patient and her husband were counseled at length regarding treatment alternatives for management of severe symptomatic aortic stenosis. The risks and benefits of surgical intervention has been discussed in detail. Long-term prognosis with medical therapy was discussed. Alternative approaches such as conventional surgical aortic valve replacement, transcatheter aortic valve replacement, and palliative medical therapy were compared and contrasted at length. This discussion was placed in the context of the patient's own specific clinical presentation and past medical history. All of their questions have been addressed.    Following the decision to proceed with transcatheter aortic valve replacement, a discussion was held regarding what types of management strategies would be attempted intraoperatively in the event of life-threatening complications, including whether or not the patient would be considered a candidate for  the use of cardiopulmonary bypass and/or conversion to open sternotomy for attempted surgical intervention. I think she would be a candidate for emergent sternotomy to manage any intraoperative complications. The patient is aware of the fact that transient use of cardiopulmonary bypass may be necessary. The patient has been advised of a variety of complications that might develop including but not limited to risks of death, stroke, paravalvular leak, aortic dissection or other major vascular complications, aortic annulus rupture, device embolization, cardiac rupture or perforation, mitral regurgitation, acute myocardial infarction, arrhythmia, heart block or bradycardia requiring permanent pacemaker  placement, congestive heart failure, respiratory failure, renal failure, pneumonia, infection, other late complications related to structural valve deterioration or migration, or other complications that might ultimately cause a temporary or permanent loss of functional independence or other long term morbidity. The patient provides full informed consent for the procedure as described and all questions were answered.       Plan:   Transfemoral TAVR using a Sapien 3 valve.          Gaye Pollack, MD

## 2021-12-29 ENCOUNTER — Inpatient Hospital Stay (HOSPITAL_COMMUNITY): Payer: Medicare Other | Admitting: Anesthesiology

## 2021-12-29 ENCOUNTER — Inpatient Hospital Stay (HOSPITAL_COMMUNITY)
Admission: RE | Admit: 2021-12-29 | Discharge: 2021-12-30 | DRG: 267 | Disposition: A | Discharge status: Home / Self Care | Payer: Medicare Other | Attending: Cardiovascular Disease | Admitting: Cardiovascular Disease

## 2021-12-29 ENCOUNTER — Other Ambulatory Visit: Payer: Self-pay

## 2021-12-29 ENCOUNTER — Encounter (HOSPITAL_COMMUNITY)
Admission: RE | Disposition: A | Discharge status: Home / Self Care | Payer: Self-pay | Source: Home / Self Care | Attending: Cardiovascular Disease

## 2021-12-29 ENCOUNTER — Inpatient Hospital Stay (HOSPITAL_COMMUNITY): Payer: Medicare Other | Admitting: Physician Assistant

## 2021-12-29 ENCOUNTER — Other Ambulatory Visit (HOSPITAL_COMMUNITY): Payer: Medicare Other

## 2021-12-29 ENCOUNTER — Encounter (HOSPITAL_COMMUNITY): Payer: Self-pay | Admitting: Cardiovascular Disease

## 2021-12-29 ENCOUNTER — Other Ambulatory Visit: Payer: Self-pay | Admitting: Physician Assistant

## 2021-12-29 ENCOUNTER — Inpatient Hospital Stay (HOSPITAL_COMMUNITY): Payer: Medicare Other

## 2021-12-29 DIAGNOSIS — I2510 Atherosclerotic heart disease of native coronary artery without angina pectoris: Secondary | ICD-10-CM | POA: Diagnosis present

## 2021-12-29 DIAGNOSIS — Z881 Allergy status to other antibiotic agents status: Secondary | ICD-10-CM | POA: Diagnosis not present

## 2021-12-29 DIAGNOSIS — E669 Obesity, unspecified: Secondary | ICD-10-CM

## 2021-12-29 DIAGNOSIS — Z6833 Body mass index (BMI) 33.0-33.9, adult: Secondary | ICD-10-CM | POA: Diagnosis not present

## 2021-12-29 DIAGNOSIS — I350 Nonrheumatic aortic (valve) stenosis: Secondary | ICD-10-CM | POA: Diagnosis not present

## 2021-12-29 DIAGNOSIS — Z952 Presence of prosthetic heart valve: Secondary | ICD-10-CM

## 2021-12-29 DIAGNOSIS — Z8249 Family history of ischemic heart disease and other diseases of the circulatory system: Secondary | ICD-10-CM

## 2021-12-29 DIAGNOSIS — Z96641 Presence of right artificial hip joint: Secondary | ICD-10-CM | POA: Diagnosis present

## 2021-12-29 DIAGNOSIS — M1990 Unspecified osteoarthritis, unspecified site: Secondary | ICD-10-CM

## 2021-12-29 DIAGNOSIS — Z885 Allergy status to narcotic agent status: Secondary | ICD-10-CM

## 2021-12-29 DIAGNOSIS — M542 Cervicalgia: Secondary | ICD-10-CM | POA: Diagnosis present

## 2021-12-29 DIAGNOSIS — H409 Unspecified glaucoma: Secondary | ICD-10-CM

## 2021-12-29 DIAGNOSIS — I5032 Chronic diastolic (congestive) heart failure: Secondary | ICD-10-CM | POA: Diagnosis present

## 2021-12-29 DIAGNOSIS — N182 Chronic kidney disease, stage 2 (mild): Secondary | ICD-10-CM | POA: Diagnosis present

## 2021-12-29 DIAGNOSIS — Z79899 Other long term (current) drug therapy: Secondary | ICD-10-CM

## 2021-12-29 DIAGNOSIS — G8929 Other chronic pain: Secondary | ICD-10-CM | POA: Diagnosis present

## 2021-12-29 DIAGNOSIS — Z6834 Body mass index (BMI) 34.0-34.9, adult: Secondary | ICD-10-CM

## 2021-12-29 DIAGNOSIS — Z96652 Presence of left artificial knee joint: Secondary | ICD-10-CM | POA: Diagnosis present

## 2021-12-29 DIAGNOSIS — Z90710 Acquired absence of both cervix and uterus: Secondary | ICD-10-CM

## 2021-12-29 DIAGNOSIS — M549 Dorsalgia, unspecified: Secondary | ICD-10-CM | POA: Diagnosis present

## 2021-12-29 DIAGNOSIS — Z006 Encounter for examination for normal comparison and control in clinical research program: Secondary | ICD-10-CM

## 2021-12-29 DIAGNOSIS — N183 Chronic kidney disease, stage 3 unspecified: Secondary | ICD-10-CM

## 2021-12-29 HISTORY — DX: Presence of prosthetic heart valve: Z95.2

## 2021-12-29 HISTORY — PX: INTRAOPERATIVE TRANSTHORACIC ECHOCARDIOGRAM: SHX6523

## 2021-12-29 HISTORY — PX: TRANSCATHETER AORTIC VALVE REPLACEMENT, TRANSFEMORAL: SHX6400

## 2021-12-29 HISTORY — DX: Chronic kidney disease, stage 3 unspecified: N18.30

## 2021-12-29 LAB — POCT I-STAT, CHEM 8
BUN: 21 mg/dL (ref 8–23)
BUN: 22 mg/dL (ref 8–23)
Calcium, Ion: 1.22 mmol/L (ref 1.15–1.40)
Calcium, Ion: 1.28 mmol/L (ref 1.15–1.40)
Chloride: 107 mmol/L (ref 98–111)
Chloride: 106 mmol/L (ref 98–111)
Creatinine, Ser: 0.7 mg/dL (ref 0.44–1.00)
Creatinine, Ser: 0.8 mg/dL (ref 0.44–1.00)
Glucose, Bld: 101 mg/dL — ABNORMAL HIGH (ref 70–99)
Glucose, Bld: 134 mg/dL — ABNORMAL HIGH (ref 70–99)
HCT: 32 % — ABNORMAL LOW (ref 36.0–46.0)
HCT: 32 % — ABNORMAL LOW (ref 36.0–46.0)
Hemoglobin: 10.9 g/dL — ABNORMAL LOW (ref 12.0–15.0)
Hemoglobin: 10.9 g/dL — ABNORMAL LOW (ref 12.0–15.0)
Potassium: 3.7 mmol/L (ref 3.5–5.1)
Potassium: 4 mmol/L (ref 3.5–5.1)
Sodium: 140 mmol/L (ref 135–145)
Sodium: 140 mmol/L (ref 135–145)
TCO2: 23 mmol/L (ref 22–32)
TCO2: 23 mmol/L (ref 22–32)

## 2021-12-29 LAB — ECHOCARDIOGRAM LIMITED
AR max vel: 1.69 cm2
AV Area VTI: 1.75 cm2
AV Area mean vel: 1.72 cm2
AV Mean grad: 25.8 mmHg
AV Peak grad: 44.9 mmHg
Ao pk vel: 3.35 m/s
S' Lateral: 2.6 cm

## 2021-12-29 SURGERY — IMPLANTATION, AORTIC VALVE, TRANSCATHETER, FEMORAL APPROACH
Anesthesia: Monitor Anesthesia Care | Site: Groin

## 2021-12-29 MED ORDER — BRIMONIDINE TARTRATE 0.15 % OP SOLN
1.0000 [drp] | Freq: Two times a day (BID) | OPHTHALMIC | Status: DC
Start: 2021-12-29 — End: 2021-12-30
  Administered 2021-12-29 – 2021-12-30 (×2): 1 [drp] via OPHTHALMIC
  Filled 2021-12-29: qty 5

## 2021-12-29 MED ORDER — SODIUM CHLORIDE 0.9% FLUSH: 3.0000 mL | INTRAVENOUS | Status: DC | PRN

## 2021-12-29 MED ORDER — MIDAZOLAM HCL 2 MG/2ML IJ SOLN
INTRAMUSCULAR | Status: AC
  Filled 2021-12-29: qty 2

## 2021-12-29 MED ORDER — PHENYLEPHRINE 80 MCG/ML (10ML) SYRINGE FOR IV PUSH (FOR BLOOD PRESSURE SUPPORT)
PREFILLED_SYRINGE | INTRAVENOUS | Status: AC
  Filled 2021-12-29: qty 10

## 2021-12-29 MED ORDER — DEXMEDETOMIDINE HCL IN NACL 400 MCG/100ML IV SOLN
INTRAVENOUS | Status: DC | PRN
  Administered 2021-12-29: 41.96 ug via INTRAVENOUS

## 2021-12-29 MED ORDER — ASPIRIN 81 MG PO CHEW
81.0000 mg | CHEWABLE_TABLET | Freq: Every day | ORAL | Status: DC
  Administered 2021-12-30: 81 mg via ORAL
  Filled 2021-12-29: qty 1

## 2021-12-29 MED ORDER — SODIUM CHLORIDE 0.9 % IV SOLN: 250.0000 mL | INTRAVENOUS | Status: DC | PRN

## 2021-12-29 MED ORDER — PROPOFOL 10 MG/ML IV BOLUS
INTRAVENOUS | Status: DC | PRN
  Administered 2021-12-29: 20 mg via INTRAVENOUS

## 2021-12-29 MED ORDER — FENTANYL CITRATE (PF) 250 MCG/5ML IJ SOLN
INTRAMUSCULAR | Status: DC | PRN
  Administered 2021-12-29 (×3): 50 ug via INTRAVENOUS

## 2021-12-29 MED ORDER — HEPARIN 6000 UNIT IRRIGATION SOLUTION
Status: DC | PRN
  Administered 2021-12-29: 3

## 2021-12-29 MED ORDER — HEPARIN 6000 UNIT IRRIGATION SOLUTION
Status: AC
  Filled 2021-12-29: qty 1500

## 2021-12-29 MED ORDER — HEPARIN SODIUM (PORCINE) 1000 UNIT/ML IJ SOLN
INTRAMUSCULAR | Status: AC
  Filled 2021-12-29: qty 20

## 2021-12-29 MED ORDER — SODIUM CHLORIDE 0.9 % IV SOLN
INTRAVENOUS | Status: AC
Start: 2021-12-29 — End: 2021-12-29

## 2021-12-29 MED ORDER — LIDOCAINE HCL 1 % IJ SOLN
INTRAMUSCULAR | Status: DC | PRN
  Administered 2021-12-29: 8 mL

## 2021-12-29 MED ORDER — GABAPENTIN 300 MG PO CAPS: 300.0000 mg | ORAL_CAPSULE | Freq: Three times a day (TID) | ORAL | Status: DC | PRN

## 2021-12-29 MED ORDER — LACTATED RINGERS IV SOLN: INTRAVENOUS | Status: DC | PRN

## 2021-12-29 MED ORDER — ACETAMINOPHEN 650 MG RE SUPP: 650.0000 mg | Freq: Four times a day (QID) | RECTAL | Status: DC | PRN

## 2021-12-29 MED ORDER — SODIUM CHLORIDE 0.9 % IV SOLN: INTRAVENOUS | Status: DC

## 2021-12-29 MED ORDER — EPHEDRINE SULFATE-NACL 50-0.9 MG/10ML-% IV SOSY
PREFILLED_SYRINGE | INTRAVENOUS | Status: DC | PRN
  Administered 2021-12-29 (×2): 10 mg via INTRAVENOUS
  Administered 2021-12-29: 5 mg via INTRAVENOUS

## 2021-12-29 MED ORDER — RALOXIFENE HCL 60 MG PO TABS
60.0000 mg | ORAL_TABLET | Freq: Every day | ORAL | Status: DC
  Administered 2021-12-29 – 2021-12-30 (×2): 60 mg via ORAL
  Filled 2021-12-29 (×2): qty 1

## 2021-12-29 MED ORDER — DORZOLAMIDE HCL-TIMOLOL MAL 2-0.5 % OP SOLN
1.0000 [drp] | Freq: Two times a day (BID) | OPHTHALMIC | Status: DC
  Administered 2021-12-29 – 2021-12-30 (×2): 1 [drp] via OPHTHALMIC
  Filled 2021-12-29: qty 10

## 2021-12-29 MED ORDER — HEPARIN SODIUM (PORCINE) 1000 UNIT/ML IJ SOLN
INTRAMUSCULAR | Status: DC | PRN
  Administered 2021-12-29: 12000 [IU] via INTRAVENOUS

## 2021-12-29 MED ORDER — PROTAMINE SULFATE 10 MG/ML IV SOLN
INTRAVENOUS | Status: DC | PRN
  Administered 2021-12-29: 120 mg via INTRAVENOUS

## 2021-12-29 MED ORDER — LATANOPROST 0.005 % OP SOLN
1.0000 [drp] | Freq: Every day | OPHTHALMIC | Status: DC
  Administered 2021-12-29: 1 [drp] via OPHTHALMIC
  Filled 2021-12-29: qty 2.5

## 2021-12-29 MED ORDER — 0.9 % SODIUM CHLORIDE (POUR BTL) OPTIME
TOPICAL | Status: DC | PRN
  Administered 2021-12-29: 1000 mL

## 2021-12-29 MED ORDER — MELATONIN 5 MG PO TABS
5.0000 mg | ORAL_TABLET | Freq: Every evening | ORAL | Status: DC | PRN
  Administered 2021-12-29: 5 mg via ORAL
  Filled 2021-12-29: qty 1

## 2021-12-29 MED ORDER — ACETAMINOPHEN 500 MG PO TABS
1000.0000 mg | ORAL_TABLET | Freq: Once | ORAL | Status: AC
  Administered 2021-12-29: 1000 mg via ORAL
  Filled 2021-12-29: qty 2

## 2021-12-29 MED ORDER — CHLORHEXIDINE GLUCONATE 4 % EX LIQD: 60.0000 mL | Freq: Once | CUTANEOUS | Status: DC

## 2021-12-29 MED ORDER — FENTANYL CITRATE (PF) 250 MCG/5ML IJ SOLN
INTRAMUSCULAR | Status: AC
  Filled 2021-12-29: qty 5

## 2021-12-29 MED ORDER — ONDANSETRON HCL 4 MG/2ML IJ SOLN
4.0000 mg | Freq: Four times a day (QID) | INTRAMUSCULAR | Status: DC | PRN
  Administered 2021-12-29: 4 mg via INTRAVENOUS
  Filled 2021-12-29: qty 2

## 2021-12-29 MED ORDER — SODIUM CHLORIDE 0.9 % IV BOLUS
500.0000 mL | Freq: Once | INTRAVENOUS | Status: AC
  Administered 2021-12-29: 500 mL via INTRAVENOUS

## 2021-12-29 MED ORDER — DEXAMETHASONE SODIUM PHOSPHATE 10 MG/ML IJ SOLN
INTRAMUSCULAR | Status: AC
  Filled 2021-12-29: qty 1

## 2021-12-29 MED ORDER — EPHEDRINE 5 MG/ML INJ
INTRAVENOUS | Status: AC
  Filled 2021-12-29: qty 5

## 2021-12-29 MED ORDER — LIDOCAINE 2% (20 MG/ML) 5 ML SYRINGE
INTRAMUSCULAR | Status: AC
  Filled 2021-12-29: qty 5

## 2021-12-29 MED ORDER — PROPOFOL 1000 MG/100ML IV EMUL
INTRAVENOUS | Status: AC
  Filled 2021-12-29: qty 100

## 2021-12-29 MED ORDER — CHLORHEXIDINE GLUCONATE 4 % EX LIQD: 30.0000 mL | CUTANEOUS | Status: DC

## 2021-12-29 MED ORDER — NITROGLYCERIN IN D5W 200-5 MCG/ML-% IV SOLN: 0.0000 ug/min | INTRAVENOUS | Status: DC

## 2021-12-29 MED ORDER — CHLORHEXIDINE GLUCONATE 0.12 % MT SOLN
15.0000 mL | Freq: Once | OROMUCOSAL | Status: AC
  Administered 2021-12-29: 15 mL via OROMUCOSAL
  Filled 2021-12-29: qty 15

## 2021-12-29 MED ORDER — IODIXANOL 320 MG/ML IV SOLN
INTRAVENOUS | Status: DC | PRN
  Administered 2021-12-29: 60 mL via INTRA_ARTERIAL

## 2021-12-29 MED ORDER — MIDAZOLAM HCL 2 MG/2ML IJ SOLN
INTRAMUSCULAR | Status: DC | PRN
  Administered 2021-12-29 (×2): 1 mg via INTRAVENOUS

## 2021-12-29 MED ORDER — ONDANSETRON HCL 4 MG/2ML IJ SOLN
INTRAMUSCULAR | Status: AC
  Filled 2021-12-29: qty 2

## 2021-12-29 MED ORDER — OXYCODONE HCL 5 MG PO TABS
5.0000 mg | ORAL_TABLET | ORAL | Status: DC | PRN
  Administered 2021-12-29 – 2021-12-30 (×2): 10 mg via ORAL
  Filled 2021-12-29 (×2): qty 2

## 2021-12-29 MED ORDER — VANCOMYCIN HCL IN DEXTROSE 1-5 GM/200ML-% IV SOLN
1000.0000 mg | Freq: Once | INTRAVENOUS | Status: AC
  Administered 2021-12-29: 1000 mg via INTRAVENOUS
  Filled 2021-12-29: qty 200

## 2021-12-29 MED ORDER — SODIUM CHLORIDE (PF) 0.9 % IJ SOLN
INTRAMUSCULAR | Status: AC
  Filled 2021-12-29: qty 10

## 2021-12-29 MED ORDER — SODIUM CHLORIDE 0.9% FLUSH
3.0000 mL | Freq: Two times a day (BID) | INTRAVENOUS | Status: DC
  Administered 2021-12-30: 3 mL via INTRAVENOUS

## 2021-12-29 MED ORDER — ACETAMINOPHEN 325 MG PO TABS: 650.0000 mg | ORAL_TABLET | Freq: Four times a day (QID) | ORAL | Status: DC | PRN

## 2021-12-29 MED ORDER — LIDOCAINE HCL (PF) 1 % IJ SOLN
INTRAMUSCULAR | Status: AC
  Filled 2021-12-29: qty 30

## 2021-12-29 SURGICAL SUPPLY — 57 items
BAG COUNTER SPONGE SURGICOUNT (BAG) ×3 IMPLANT
BAG DECANTER FOR FLEXI CONT (MISCELLANEOUS) IMPLANT
BLADE CLIPPER SURG (BLADE) IMPLANT
BLADE OSCILLATING /SAGITTAL (BLADE) IMPLANT
BLADE STERNUM SYSTEM 6 (BLADE) IMPLANT
CABLE ADAPT CONN TEMP 6FT (ADAPTER) ×3 IMPLANT
CATH DIAG EXPO 6F AL1 (CATHETERS) IMPLANT
CATH DIAG EXPO 6F VENT PIG 145 (CATHETERS) ×6 IMPLANT
CATH INFINITI 6F AL2 (CATHETERS) IMPLANT
CATH S G BIP PACING (CATHETERS) ×3 IMPLANT
CHLORAPREP W/TINT 26 (MISCELLANEOUS) ×3 IMPLANT
CLOSURE MYNX CONTROL 6F/7F (Vascular Products) ×1 IMPLANT
CNTNR URN SCR LID CUP LEK RST (MISCELLANEOUS) ×4 IMPLANT
CONT SPEC 4OZ STRL OR WHT (MISCELLANEOUS) ×6
COVER BACK TABLE 80X110 HD (DRAPES) ×3 IMPLANT
DECANTER SPIKE VIAL GLASS SM (MISCELLANEOUS) ×3 IMPLANT
DERMABOND ADVANCED (GAUZE/BANDAGES/DRESSINGS) ×1
DERMABOND ADVANCED .7 DNX12 (GAUZE/BANDAGES/DRESSINGS) ×2 IMPLANT
DEVICE CLOSURE PERCLS PRGLD 6F (VASCULAR PRODUCTS) ×4 IMPLANT
DRSG TEGADERM 4X4.75 (GAUZE/BANDAGES/DRESSINGS) ×7 IMPLANT
ELECT REM PT RETURN 9FT ADLT (ELECTROSURGICAL) ×3
ELECTRODE REM PT RTRN 9FT ADLT (ELECTROSURGICAL) ×2 IMPLANT
GAUZE SPONGE 2X2 8PLY NS (GAUZE/BANDAGES/DRESSINGS) ×2 IMPLANT
GAUZE SPONGE 4X4 12PLY STRL (GAUZE/BANDAGES/DRESSINGS) ×3 IMPLANT
GLOVE BIO SURGEON STRL SZ7.5 (GLOVE) ×3 IMPLANT
GLOVE BIO SURGEON STRL SZ8 (GLOVE) IMPLANT
GLOVE ORTHO TXT STRL SZ7.5 (GLOVE) IMPLANT
GOWN STRL REUS W/ TWL LRG LVL3 (GOWN DISPOSABLE) IMPLANT
GOWN STRL REUS W/ TWL XL LVL3 (GOWN DISPOSABLE) ×2 IMPLANT
GOWN STRL REUS W/TWL LRG LVL3 (GOWN DISPOSABLE)
GOWN STRL REUS W/TWL XL LVL3 (GOWN DISPOSABLE) ×3
GUIDEWIRE SAFE TJ AMPLATZ EXST (WIRE) ×3 IMPLANT
KIT BASIN OR (CUSTOM PROCEDURE TRAY) ×3 IMPLANT
KIT HEART LEFT (KITS) ×3 IMPLANT
KIT SAPIAN 3 ULTRA RESILIA 23 (Valve) ×1 IMPLANT
KIT TURNOVER KIT B (KITS) ×3 IMPLANT
NS IRRIG 1000ML POUR BTL (IV SOLUTION) ×3 IMPLANT
PACK ENDO MINOR (CUSTOM PROCEDURE TRAY) ×3 IMPLANT
PAD ARMBOARD 7.5X6 YLW CONV (MISCELLANEOUS) ×6 IMPLANT
PAD ELECT DEFIB RADIOL ZOLL (MISCELLANEOUS) ×3 IMPLANT
PERCLOSE PROGLIDE 6F (VASCULAR PRODUCTS) ×6
POSITIONER HEAD DONUT 9IN (MISCELLANEOUS) ×3 IMPLANT
SET MICROPUNCTURE 5F STIFF (MISCELLANEOUS) ×3 IMPLANT
SHEATH BRITE TIP 7FR 35CM (SHEATH) ×3 IMPLANT
SHEATH PINNACLE 6F 10CM (SHEATH) ×3 IMPLANT
SHEATH PINNACLE 8F 10CM (SHEATH) ×3 IMPLANT
SLEEVE REPOSITIONING LENGTH 30 (MISCELLANEOUS) ×3 IMPLANT
STOPCOCK MORSE 400PSI 3WAY (MISCELLANEOUS) ×6 IMPLANT
SUT PROLENE 6 0 C 1 30 (SUTURE) IMPLANT
SUT SILK  1 MH (SUTURE) ×3
SUT SILK 1 MH (SUTURE) ×2 IMPLANT
SYR 50ML LL SCALE MARK (SYRINGE) ×3 IMPLANT
SYR BULB IRRIG 60ML STRL (SYRINGE) IMPLANT
TOWEL GREEN STERILE (TOWEL DISPOSABLE) ×6 IMPLANT
TRANSDUCER W/STOPCOCK (MISCELLANEOUS) ×6 IMPLANT
WIRE EMERALD 3MM-J .035X150CM (WIRE) ×3 IMPLANT
WIRE EMERALD 3MM-J .035X260CM (WIRE) ×3 IMPLANT

## 2021-12-29 NOTE — Anesthesia Postprocedure Evaluation (Signed)
Anesthesia Post Note  Patient: AYAHNA SOLAZZO  Procedure(s) Performed: Transcatheter Aortic Valve Replacement, Transfemoral USING 23MM EDWARDS SAPIEN 3 ULTRA (Bilateral: Groin) INTRAOPERATIVE TRANSTHORACIC ECHOCARDIOGRAM     Patient location during evaluation: PACU Anesthesia Type: MAC Level of consciousness: awake and alert Pain management: pain level controlled Vital Signs Assessment: post-procedure vital signs reviewed and stable Respiratory status: spontaneous breathing, nonlabored ventilation and respiratory function stable Cardiovascular status: stable, blood pressure returned to baseline and bradycardic Anesthetic complications: no   No notable events documented.  Last Vitals:  Vitals:   12/29/21 0958 12/29/21 1027  BP: (!) 103/50 (!) 95/47  Pulse: (!) 52   Resp: 11   Temp:    SpO2:      Last Pain:  Vitals:   12/29/21 0951  TempSrc: Temporal  PainSc: 0-No pain                 Audry Pili

## 2021-12-29 NOTE — Progress Notes (Signed)
  [place] VALVE TEAM  Patient doing well s/p TAVR. She is hemodynamically stable. Groin sites stable. ECG with sinus brady with no high grade block. Arterial line discontinued and transferred to 4E. Plan for early ambulation after bedrest completed and hopeful discharge over the next 24-48 hours.   Angelena Form PA-C  [facility]  Pager [phone]

## 2021-12-29 NOTE — Interval H&P Note (Signed)
History and Physical Interval Note:  12/29/2021 6:29 AM  [name]  has presented today for surgery, with the diagnosis of Severe Aortic Stenosis.  The various methods of treatment have been discussed with the patient and family. After consideration of risks, benefits and other options for treatment, the patient has consented to  Procedure(s): Transcatheter Aortic Valve Replacement, Transfemoral (N/A) INTRAOPERATIVE TRANSTHORACIC ECHOCARDIOGRAM (N/A) as a surgical intervention.  The patient's history has been reviewed, patient examined, no change in status, stable for surgery.  I have reviewed the patient's chart and labs.  Questions were answered to the patient's satisfaction.     [name]

## 2021-12-29 NOTE — Progress Notes (Signed)
R groin dressing, saturated.  Pressure held above site, dressing changed. Site not actively bleeding.  Bedrest supposed to be up at 1340, will extend for now and reassess site.

## 2021-12-29 NOTE — Op Note (Signed)
[facility]   MULTIDISCIPLINARY HEART VALVE TEAM   TAVR OPERATIVE NOTE   Date of Procedure:  12/29/2021  Preoperative Diagnosis: Severe Aortic Stenosis   Postoperative Diagnosis: Same   Procedure:   Transcatheter Aortic Valve Replacement - Percutaneous Right Transfemoral Approach  Edwards Sapien 3 Ultra Resilia THV (size 23 mm, model # 9755RSL, serial # [id number])   Co-Surgeons:  Gaye Pollack, MD and Lauree Chandler   Anesthesiologist:  Fransisco Beau  Echocardiographer:  Gasper Sells  Pre-operative Echo Findings: Severe aortic stenosis Normal left ventricular systolic function  Post-operative Echo Findings: Mild paravalvular leak Normal left ventricular systolic function   BRIEF CLINICAL NOTE AND INDICATIONS FOR SURGERY  This 76 year old woman has stage D, severe, symptomatic aortic stenosis with NYHA class 2 symptoms of progressive exertional shortness of breath and fatigue consistent with chronic diastolic heart failure. I have personally reviewed her echo, cath and CTA studies. Her echo in 06/2021 showed a severely calcified aortic valve with thickened and immobile leaflets with a mean gradient of 47 mm Hg and an AVA of 0.79 consistent with severe AS. Her cath showed no CAD with a mean gradient of 40 mm Hg. I agree that AVR is indicated for relief of her symptoms, prevention of progressive LV dysfunction and survival benefit. Given her age and reduced mobility I think TAVR would be the best option for her. Her gated cardiac CTA shows anatomy suitable for TAVR using a Sapien 3 valve. Her abdominal and pelvic CTA shows adequate pelvic vascular anatomy to allow transfemoral insertion.    The patient and her husband were counseled at length regarding treatment alternatives for management of severe symptomatic aortic stenosis. The risks and benefits of surgical intervention has been discussed in detail. Long-term prognosis with medical therapy was discussed.  Alternative approaches such as conventional surgical aortic valve replacement, transcatheter aortic valve replacement, and palliative medical therapy were compared and contrasted at length. This discussion was placed in the context of the patient's own specific clinical presentation and past medical history. All of their questions have been addressed.    Following the decision to proceed with transcatheter aortic valve replacement, a discussion was held regarding what types of management strategies would be attempted intraoperatively in the event of life-threatening complications, including whether or not the patient would be considered a candidate for the use of cardiopulmonary bypass and/or conversion to open sternotomy for attempted surgical intervention. I think she would be a candidate for emergent sternotomy to manage any intraoperative complications. The patient is aware of the fact that transient use of cardiopulmonary bypass may be necessary. The patient has been advised of a variety of complications that might develop including but not limited to risks of death, stroke, paravalvular leak, aortic dissection or other major vascular complications, aortic annulus rupture, device embolization, cardiac rupture or perforation, mitral regurgitation, acute myocardial infarction, arrhythmia, heart block or bradycardia requiring permanent pacemaker placement, congestive heart failure, respiratory failure, renal failure, pneumonia, infection, other late complications related to structural valve deterioration or migration, or other complications that might ultimately cause a temporary or permanent loss of functional independence or other long term morbidity. The patient provides full informed consent for the procedure as described and all questions were answered.     DETAILS OF THE OPERATIVE PROCEDURE  PREPARATION:    The patient was brought to the operating room on the above mentioned date and appropriate  monitoring was established by the anesthesia team. The patient was placed in the supine position  on the operating table.  Intravenous antibiotics were administered. The patient was monitored closely throughout the procedure under conscious sedation.   Baseline transthoracic echocardiogram was performed. The patient's abdomen and both groins were prepped and draped in a sterile manner. A time out procedure was performed.   PERIPHERAL ACCESS:    Using the modified Seldinger technique, femoral arterial and venous access was obtained with placement of 6 Fr sheaths on the left side.  A pigtail diagnostic catheter was passed through the left arterial sheath under fluoroscopic guidance into the aortic root.  A temporary transvenous pacemaker catheter was passed through the left femoral venous sheath under fluoroscopic guidance into the right ventricle.  The pacemaker was tested to ensure stable lead placement and pacemaker capture. Aortic root angiography was performed in order to determine the optimal angiographic angle for valve deployment.   TRANSFEMORAL ACCESS:   Percutaneous transfemoral access and sheath placement was performed using ultrasound guidance.  The right common femoral artery was cannulated using a micropuncture needle and appropriate location was verified using hand injection angiogram.  A pair of Abbott Perclose percutaneous closure devices were placed and a 6 French sheath replaced into the femoral artery.  The patient was heparinized systemically and ACT verified > 250 seconds.    A 14 Fr transfemoral E-sheath was introduced into the right common femoral artery after progressively dilating over an Amplatz superstiff wire. An AL-1 catheter was used to direct a straight-tip exchange length wire across the native aortic valve into the left ventricle. This was exchanged out for a pigtail catheter and position was confirmed in the LV apex. Simultaneous LV and Ao pressures were recorded.  The  pigtail catheter was exchanged for a Safari wire in the LV apex.   BALLOON AORTIC VALVULOPLASTY:   Not performed   TRANSCATHETER HEART VALVE DEPLOYMENT:   An Edwards Sapien 3 Ultra transcatheter heart valve (size 23 mm) was prepared and crimped per manufacturer's guidelines, and the proper orientation of the valve is confirmed on the [employer] delivery system. The valve was advanced through the introducer sheath using normal technique until in an appropriate position in the abdominal aorta beyond the sheath tip. The balloon was then retracted and using the fine-tuning wheel was centered on the valve. The valve was then advanced across the aortic arch using appropriate flexion of the catheter. The valve was carefully positioned across the aortic valve annulus. The Commander catheter was retracted using normal technique. Once final position of the valve has been confirmed by angiographic assessment, the valve is deployed during rapid ventricular pacing to maintain systolic blood pressure < 50 mmHg and pulse pressure < 10 mmHg. The balloon inflation is held for >3 seconds after reaching full deployment volume. Once the balloon has fully deflated the balloon is retracted into the ascending aorta and valve function is assessed using echocardiography. There is felt to be mild paravalvular leak and no central aortic insufficiency.  The patient's hemodynamic recovery following valve deployment is good.  The deployment balloon and guidewire are both removed.    PROCEDURE COMPLETION:   The sheath was removed and femoral artery closure performed.  Protamine was administered once femoral arterial repair was complete. The temporary pacemaker, pigtail catheter and femoral sheaths were removed with manual pressure used for venous hemostasis.  A Mynx femoral closure device was utilized following removal of the diagnostic sheath in the left femoral artery.  The patient tolerated the procedure well and is  transported to the cath lab recovery area  in stable condition. There were no immediate intraoperative complications. All sponge instrument and needle counts are verified correct at completion of the operation.   No blood products were administered during the operation.  The patient received a total of 60 mL of intravenous contrast during the procedure.   Gaye Pollack, MD 12/29/2021 9:53 AM

## 2021-12-29 NOTE — Anesthesia Procedure Notes (Addendum)
Arterial Line Insertion Start/End5/23/2023 7:28 AM, 12/29/2021 7:42 AM Performed by: Lewie Loron, MD  Patient location: Pre-op. Preanesthetic checklist: patient identified, IV checked, site marked, risks and benefits discussed, surgical consent, monitors and equipment checked, pre-op evaluation, timeout performed and anesthesia consent Lidocaine 1% used for infiltration Left, radial was placed Catheter size: 20 G Hand hygiene performed , maximum sterile barriers used  and Seldinger technique used  Attempts: 3 Procedure performed using ultrasound guided technique. Ultrasound Notes:anatomy identified, needle tip was noted to be adjacent to the nerve/plexus identified and image(s) printed for medical record Following insertion, dressing applied and Biopatch. Post procedure assessment: normal and unchanged  Post procedure complications: second provider assisted. Patient tolerated the procedure well with no immediate complications.

## 2021-12-29 NOTE — Transfer of Care (Signed)
Immediate Anesthesia Transfer of Care Note  Patient: TYKERRIA MCCUBBINS  Procedure(s) Performed: Transcatheter Aortic Valve Replacement, Transfemoral USING 23MM EDWARDS SAPIEN 3 ULTRA (Bilateral: Groin) INTRAOPERATIVE TRANSTHORACIC ECHOCARDIOGRAM  Patient Location: Cath Lab  Anesthesia Type:MAC  Level of Consciousness: drowsy and patient cooperative  Airway & Oxygen Therapy: Patient Spontanous Breathing and Patient connected to nasal cannula oxygen  Post-op Assessment: Report given to RN and Post -op Vital signs reviewed and stable  Post vital signs: Reviewed and stable  Last Vitals:  Vitals Value Taken Time  BP 97/52 12/29/21 0951  Temp 36.6 C 12/29/21 0951  Pulse 54 12/29/21 0951  Resp 12 12/29/21 0951  SpO2      Last Pain:  Vitals:   12/29/21 0951  TempSrc: Temporal  PainSc: 0-No pain         Complications: No notable events documented.

## 2021-12-29 NOTE — Progress Notes (Addendum)
Pt R groin site bleeding while up ambulating with mobility.  Pt assisted back to bed, pressure held above site for , dressing changed.  Site no longer bleeding, no hematoma noted, just bruising.  Bedrest restarted, to end at 2100.  VS remained stable during this time and pt otherwise asymptomatic.  K. Janee Morn, PA notified. No new orders at this time.

## 2021-12-29 NOTE — Progress Notes (Signed)
Mobility Specialist: Progress Note   12/29/21 1712  Mobility  Activity Ambulated with assistance in hallway  Level of Assistance Contact guard assist, steadying assist  Assistive Device Cane  Distance Ambulated (ft) 20 ft  Activity Response Tolerated fair  $Mobility charge 1 Mobility   Pre-Mobility: 60 HR, 100% SpO2 Post-Mobility: 66 HR, 100% SpO2  Pt received in the bed and agreeable to ambulation. Distance limited d/t bleeding from groin. Pt wheeled back to room via chair and assisted back to bed. Pressure held for 20 minutes, RN present in the room. Pt is in the bed with call bell and phone in reach.   [facility] Lemma Tetro Mobility Specialist Mobility Specialist 5 North: [phone] Mobility Specialist 6 North: [phone]

## 2021-12-29 NOTE — Progress Notes (Signed)
Pt received from cath lab.  Assessment and VS WNL.  Groin sites clean and dry.  Pt alert and without complaints.  Oriented to the room and call bell.  Educated on bedrest until 1340.

## 2021-12-29 NOTE — Discharge Summary (Incomplete)
[facility]VE TEAM  Discharge Summary    Patient ID: [name] MRN: [id number]; DOB: 01-Aug-1946  Admit date: 12/29/2021 Discharge date: 12/30/2021  Primary Care Provider: Maury Dus, MD / Dr. Angelena Form, MD & Dr. Cyndia Bent, MD (TAVR) Primary Cardiologist: Dr. Gwenlyn Found, MD   Discharge Diagnoses    Principal Problem:   S/P TAVR (transcatheter aortic valve replacement) Active Problems:   Severe aortic stenosis   Glaucoma  Allergies Allergies  Allergen Reactions   Codeine Nausea Only   Oxaprozin     Other reaction(s): nausea   Keflex [Cephalexin] Rash    Diagnostic Studies/Procedures    TAVR OPERATIVE NOTE     Date of Procedure:                12/29/2021   Preoperative Diagnosis:      Severe Aortic Stenosis    Postoperative Diagnosis:    Same    Procedure:        Transcatheter Aortic Valve Replacement - Percutaneous Right Transfemoral Approach             Edwards Sapien 3 Ultra Resilia THV (size 23 mm, model # [id number], serial # [id number])              Co-Surgeons:                        Gaye Pollack, MD and Lauree Chandler    Anesthesiologist:                  Fransisco Beau   Echocardiographer:              Gasper Sells   Pre-operative Echo Findings: Severe aortic stenosis Normal left ventricular systolic function   Post-operative Echo Findings: Mild paravalvular leak Normal left ventricular systolic function _____________  Echo 12/30/21: Completed but pending formal read at the time of discharge   History of Present Illness     [name] is a 76 y.o. female with a history of arthritis, CKD stage II, and severe symptomatic aortic stenosis who presented to [facility] on 12/29/21 for planned TAVR.   [name] was initially referred to Dr. Gwenlyn Found 05/2021 from her PCP for the evaluation of a murmur and found to have severe aortic stenosis by echocardiogram. Given that she was rather asymptomatic, plan was for continued  surveillance. By 10/2021, she was having progressive SOB with less activity therefore, she underwent R/LHC to better define coronary anatomy. Cath from 10/12/21 showed no CAD however confirmed severe aortic stenosis. She was then referred to Dr. Angelena Form for TAVR consideration. She underwent pre TAVR imaging and was evaluated by the multidisciplinary valve team and felt to have severe, symptomatic aortic stenosis and to be a suitable candidate for TAVR, which was set up for 12/29/21.   Hospital Course     Severe AS: s/p successful TAVR with a 23 mm Edwards Sapien 3 THV via the TF approach on 12/29/21. Post operative echo completed but pending formal read. Groin sites are stable, but right side with some ecchymosis and oozing. ECG with sinus brady but no high grade heart block. Continue ASA monotherapy. She has ambulated with CRI with no issues. Post TAVR instructions reviewed with the patient with understanding. Plan for discharge home today with close follow up in the outpatient setting.   Thyroid nodule: noted on pre TAVR CT: "Enlarged and heterogeneous left thyroid lobe. Recommend further evaluation with thyroid ultrasound." Will discuss  in the outpatient setting.   Nausea: patient had some intermittent nausea likely 2/2 anesthesia. Will Rx Zofran for home.   Tearfulness: pt with exacerbation of chronic neck and back pain and nausea. She thinks going home will improve her mood.   Consultants: None    The patient has been seen and examined by Dr. Angelena Form who feels that she is stable and ready for discharge today, 12/30/21.  _____________  Discharge Vitals Blood pressure (!) 147/81, pulse 71, temperature 97.6 F (36.4 C), temperature source Oral, resp. rate 20, height 5\' 2"  (1.575 m), weight 84.8 kg, SpO2 99 %.  Filed Weights   12/29/21 0607 12/30/21 0504  Weight: 83.9 kg 84.8 kg     GEN: Well nourished, well developed, appears tearful HEENT: normal Neck: no JVD or masses Cardiac: RRR; no  murmurs, rubs, or gallops,no edema  Respiratory:  clear to auscultation bilaterally, normal work of breathing GI: soft, nontender, nondistended, + BS MS: no deformity or atrophy Skin: warm and dry, no rash  Groin sites clear without hematoma. Right side with ecchymosis and some bright red oozing.  Neuro:  Alert and Oriented x 3, Strength and sensation are intact Psych: euthymic mood, full affect   Labs & Radiologic Studies    CBC Recent Labs    12/29/21 1011 12/30/21 0317  WBC  --  5.8  HGB 10.9* 11.4*  HCT 32.0* 33.6*  MCV  --  93.9  PLT  --  123XX123*   Basic Metabolic Panel Recent Labs    12/29/21 1011 12/30/21 0317  NA 140 135  K 4.0 3.8  CL 106 108  CO2  --  21*  GLUCOSE 134* 113*  BUN 22 14  CREATININE 0.80 0.77  CALCIUM  --  8.3*  MG  --  1.9   Liver Function Tests No results for input(s): AST, ALT, ALKPHOS, BILITOT, PROT, ALBUMIN in the last 72 hours. No results for input(s): LIPASE, AMYLASE in the last 72 hours. Cardiac Enzymes No results for input(s): CKTOTAL, CKMB, CKMBINDEX, TROPONINI in the last 72 hours. BNP Invalid input(s): POCBNP D-Dimer No results for input(s): DDIMER in the last 72 hours. Hemoglobin A1C No results for input(s): HGBA1C in the last 72 hours. Fasting Lipid Panel No results for input(s): CHOL, HDL, LDLCALC, TRIG, CHOLHDL, LDLDIRECT in the last 72 hours. Thyroid Function Tests No results for input(s): TSH, T4TOTAL, T3FREE, THYROIDAB in the last 72 hours.  Invalid input(s): FREET3 _____________  DG Chest 2 View  Result Date: 12/27/2021 CLINICAL DATA:  Preoperative assessment for TAVR EXAM: CHEST - 2 VIEW COMPARISON:  CT chest 11/12/2021 FINDINGS: No focal consolidation. No pleural effusion or pneumothorax. Heart and mediastinal contours are unremarkable. No acute osseous abnormality. Partially visualized posterior lumbar interbody fusion. IMPRESSION: No active cardiopulmonary disease. Electronically Signed   By: Kathreen Devoid M.D.   On:  12/27/2021 08:06   ECHOCARDIOGRAM LIMITED  Result Date: 12/29/2021    ECHOCARDIOGRAM LIMITED REPORT   Patient Name:   [name] Date of Exam: 12/29/2021 Medical Rec #:  [id number]      Height:       62.0 in Accession #:    [id number]     Weight:       185.0 lb Date of Birth:  1946-07-27      BSA:          1.849 m Patient Age:    42 years       BP:  135/68 mmHg Patient Gender: F              HR:           50 bpm. Exam Location:  Inpatient Procedure: Limited Echo, Cardiac Doppler and Color Doppler Indications:     I35.2 Nonrheumatic aortic (valve) stenosis with insufficiency  History:         Patient has prior history of Echocardiogram examinations, most                  recent 06/23/2021. Aortic Valve Disease. Severe aortic                  stenosis.                  Aortic Valve: valve is present in the aortic position.  Sonographer:     Roseanna Rainbow RDCS Referring Phys:  [place] Diagnosing Phys: Rudean Haskell MD  Sonographer Comments: TAVR procedure using 3mm Edwards Sapien valve. IMPRESSIONS  1. Interventional TTE for TAVR Placement  2. Prior to procedure, calcified aortic valve. Mild central aortic regurgitation. Severe aortic stenosis. Mean gradient 40 mm Hg, Peak Gradient 74 mm Hg, DVI 0.34, AVA 1.08 cm2 by continuity equation.  3. Post procedure, and post wire removal, placement of a 23 mm Edwards Sapien Valve. Mild paravalvular leak with two small jets at the 10 o'clock and 12o o'clock position. Mean gradient 11 mm Hg, Peak gradient 20 mm Hg, DVI 0.77, EOA 2.17 cm2.  4. The aortic valve has been repaired/replaced. Aortic valve regurgitation is mild. There is a valve present in the aortic position.  5. Left ventricular ejection fraction, by estimation, is 60 to 65%. The left ventricle has normal function. The left ventricle has no regional wall motion abnormalities.  6. Right ventricular systolic function is normal. The right ventricular size is normal.  7. Tricuspid  valve regurgitation is mild to moderate. Comparison(s): Successfull TAVR placement. FINDINGS  Left Ventricle: Left ventricular ejection fraction, by estimation, is 60 to 65%. The left ventricle has normal function. The left ventricle has no regional wall motion abnormalities. Right Ventricle: The right ventricular size is normal. Right ventricular systolic function is normal. Tricuspid Valve: Tricuspid valve regurgitation is mild to moderate. No evidence of tricuspid stenosis. Aortic Valve: 40 mm Peak 74 DVI 0.34 AVA 1.08 cm2. The aortic valve has been repaired/replaced. Aortic valve regurgitation is mild. Aortic valve mean gradient measures 25.8 mmHg. Aortic valve peak gradient measures 44.9 mmHg. Aortic valve area, by VTI measures 1.75 cm. There is a valve present in the aortic position. Pulmonic Valve: Pulmonic valve regurgitation is mild. LEFT VENTRICLE PLAX 2D LVIDd:         4.40 cm LVIDs:         2.60 cm LV PW:         1.30 cm LV IVS:        1.30 cm LVOT diam:     2.15 cm LV SV:         135 LV SV Index:   73 LVOT Area:     3.63 cm  LEFT ATRIUM         Index LA diam:    4.10 cm 2.22 cm/m  AORTIC VALVE AV Area (Vmax):    1.69 cm AV Area (Vmean):   1.72 cm AV Area (VTI):     1.75 cm AV Vmax:           335.00 cm/s AV Vmean:  225.500 cm/s AV VTI:            0.774 m AV Peak Grad:      44.9 mmHg AV Mean Grad:      25.8 mmHg LVOT Vmax:         156.00 cm/s LVOT Vmean:        106.950 cm/s LVOT VTI:          0.373 m LVOT/AV VTI ratio: 0.48  AORTA Ao Root diam: 3.00 cm  SHUNTS Systemic VTI:  0.37 m Systemic Diam: 2.15 cm Rudean Haskell MD Electronically signed by Rudean Haskell MD Signature Date/Time: 12/29/2021/10:18:20 AM    Final    Structural Heart Procedure  Result Date: 12/29/2021 See surgical note for result.  HYBRID OR IMAGING ([facility] ONLY)  Result Date: 12/29/2021 There is no interpretation for this exam.  This order is for images obtained during a surgical procedure.  Please See  "Surgeries" Tab for more information regarding the procedure.    Disposition   Pt is being discharged home today in good condition.  Follow-up Plans & Appointments     Follow-up Information     Eileen Stanford, PA-C. Go on 01/06/2022.   Specialties: Cardiology, Radiology Why: @ 2:30pm, please arrive at least 10 minutes early. Contact information: [address] [place] [address] [phone]                  Discharge Medications   Allergies as of 12/30/2021       Reactions   Codeine Nausea Only   Oxaprozin    Other reaction(s): nausea   Keflex [cephalexin] Rash        Medication List     TAKE these medications    acetaminophen 500 MG tablet Commonly known as: TYLENOL Take 500 mg by mouth every 6 (six) hours as needed for moderate pain.   acyclovir 400 MG tablet Commonly known as: ZOVIRAX Take 400 mg by mouth 2 (two) times daily as needed (Cold sore).   Alphagan P 0.1 % Soln Generic drug: brimonidine Place 1 drop into both eyes every 12 (twelve) hours.   aspirin EC 81 MG tablet Take 1 tablet (81 mg total) by mouth daily. Swallow whole.   CALCIUM 600 + D PO Take 1 tablet by mouth 2 (two) times daily.   cholecalciferol 25 MCG (1000 UNIT) tablet Commonly known as: VITAMIN D3 Take 1,000 Units by mouth daily. Midday   clindamycin 150 MG capsule Commonly known as: CLEOCIN Take 600 mg by mouth See admin instructions. Take 1 hour before dental procedures   docusate sodium 100 MG capsule Commonly known as: COLACE Take 100 mg by mouth 2 (two) times daily.   dorzolamide-timolol 22.3-6.8 MG/ML ophthalmic solution Commonly known as: COSOPT Place 1 drop into both eyes every 12 (twelve) hours.   famotidine 20 MG tablet Commonly known as: PEPCID Take 20 mg by mouth daily as needed for indigestion (if take Aleve).   Flax Seed Oil 1000 MG Caps Take 1,000 mg by mouth daily.   gabapentin 300 MG capsule Commonly known as:  NEURONTIN Take 300 mg by mouth 3 (three) times daily as needed (pain).   GLUCOSAMINE 1500 COMPLEX PO Take 1 tablet by mouth 2 (two) times daily. 1500 mg /1200 mg   guaiFENesin 200 MG tablet Take 200 mg by mouth every 8 (eight) hours as needed for cough or to loosen phlegm. [place] Brand Mucinex   latanoprost 0.005 % ophthalmic solution Commonly known as:  XALATAN Place 1 drop into both eyes at bedtime.   meclizine 25 MG tablet Commonly known as: ANTIVERT Take 25 mg by mouth 3 (three) times daily as needed (for motion sickness).   melatonin 5 MG Tabs Take 5 mg by mouth at bedtime as needed (Sleep).   METAMUCIL PO Take 1 capsule by mouth 2 (two) times daily. [place]   multivitamin with minerals Tabs tablet Take 1 tablet by mouth daily. [place]   naproxen sodium 220 MG tablet Commonly known as: ALEVE Take 220 mg by mouth daily as needed (pain).   ondansetron 4 MG tablet Commonly known as: Zofran Take 1 tablet (4 mg total) by mouth daily as needed for nausea or vomiting.   polyethylene glycol 17 g packet Commonly known as: MIRALAX / GLYCOLAX Take 17 g by mouth daily.   raloxifene 60 MG tablet Commonly known as: EVISTA Take 60 mg by mouth daily.   SALONPAS EX Place 1 patch onto the skin at bedtime as needed (back pain). Pain patch   Sodium Fluoride 5000 PPM 1.1 % Pste Generic drug: Sodium Fluoride Place 1 application onto teeth at bedtime.   Systane Complete 0.6 % Soln Generic drug: Propylene Glycol Place 1 drop into both eyes as needed (dry eyes).   trolamine salicylate 10 % cream Commonly known as: ASPERCREME Apply 1 application. topically daily as needed (joint or muscle pain). With lidocaine       Outstanding Labs/Studies   none  Duration of Discharge Encounter   Greater than 30 minutes including physician time.  Mable Fill, PA-C 12/30/2021, 11:46 AM [phone]   I have personally seen and examined this patient. I agree with the  assessment and plan as outlined above.  Doing well post TAVR. Groins stable. Labs reviewed. BP stable. Echo with trivial PVL. D/C home today.   Lauree Chandler 12/30/2021 7:00 PM

## 2021-12-29 NOTE — CV Procedure (Signed)
[facility]  TAVR OPERATIVE NOTE   Date of Procedure:  12/29/2021  Preoperative Diagnosis: Severe Aortic Stenosis   Postoperative Diagnosis: Same   Procedure:   Transcatheter Aortic Valve Replacement - Transfemoral Approach  Edwards Sapien 3 THV (size 23 mm, model # [id number], serial # [id number])   Co-Surgeons:  Lauree Chandler, MD and Gaye Pollack, MD  Anesthesiologist:  Fransisco Beau  Echocardiographer:  Gasper Sells  Pre-operative Echo Findings: Severe aortic stenosis Normal left ventricular systolic function  Post-operative Echo Findings: Mild paravalvular leak Normal left ventricular systolic function  BRIEF CLINICAL NOTE AND INDICATIONS FOR SURGERY  76 yo female with history of arthritis and severe aortic stenosis. Echo in November 2022 with LVEF=60-65%, mild MR. Severe thickening and calcification of the aortic valve with severe stenosis. Mean gradient 46.9 mmHg, peak gradient 84.8 mmHg, AVA 0.79 cm2, DI 0.28. Mild to moderate AI. Cardiac cath 10/12/21 with no evidence of CAD. Mean gradient 40 mmHg. Normal right and left heart pressures. She has had progressive dyspnea with moderate exertion such as climbing hills and steps. She has progressive fatigue.  During the course of the patient's preoperative work up they have been evaluated comprehensively by a multidisciplinary team of specialists coordinated through the [facility] in the [facility].  They have been demonstrated to suffer from symptomatic severe aortic stenosis as noted above. The patient has been counseled extensively as to the relative risks and benefits of all options for the treatment of severe aortic stenosis including long term medical therapy, conventional surgery for aortic valve replacement, and transcatheter aortic valve replacement.  The patient has been independently evaluated by Dr. Cyndia Bent with CT surgery and they are felt to be at high  risk for conventional surgical aortic valve replacement. The surgeon indicated the patient would be a poor candidate for conventional surgery. Based upon review of all of the patient's preoperative diagnostic tests they are felt to be candidate for transcatheter aortic valve replacement using the transfemoral approach as an alternative to high risk conventional surgery.    Following the decision to proceed with transcatheter aortic valve replacement, a discussion has been held regarding what types of management strategies would be attempted intraoperatively in the event of life-threatening complications, including whether or not the patient would be considered a candidate for the use of cardiopulmonary bypass and/or conversion to open sternotomy for attempted surgical intervention.  The patient has been advised of a variety of complications that might develop peculiar to this approach including but not limited to risks of death, stroke, paravalvular leak, aortic dissection or other major vascular complications, aortic annulus rupture, device embolization, cardiac rupture or perforation, acute myocardial infarction, arrhythmia, heart block or bradycardia requiring permanent pacemaker placement, congestive heart failure, respiratory failure, renal failure, pneumonia, infection, other late complications related to structural valve deterioration or migration, or other complications that might ultimately cause a temporary or permanent loss of functional independence or other long term morbidity.  The patient provides full informed consent for the procedure as described and all questions were answered preoperatively.    DETAILS OF THE OPERATIVE PROCEDURE  PREPARATION:   The patient is brought to the operating room on the above mentioned date and central monitoring was established by the anesthesia team including placement of a radial arterial line. The patient is placed in the supine position on the operating  table.  Intravenous antibiotics are administered. Conscious sedation is used.   Baseline transthoracic echocardiogram was performed. The patient's  chest, abdomen, both groins, and both lower extremities are prepared and draped in a sterile manner. A time out procedure is performed.   PERIPHERAL ACCESS:   Using the modified Seldinger technique, femoral arterial and venous access were obtained with placement of a 6 Fr sheath in the artery and a 7 Fr sheath in the vein on the left side using u/s guidance.  A pigtail diagnostic catheter was passed through the femoral arterial sheath under fluoroscopic guidance into the aortic root.  A temporary transvenous pacemaker catheter was passed through the femoral venous sheath under fluoroscopic guidance into the right ventricle.  The pacemaker was tested to ensure stable lead placement and pacemaker capture. Aortic root angiography was performed in order to determine the optimal angiographic angle for valve deployment.  TRANSFEMORAL ACCESS:  A micropuncture kit was used to gain access to the right femoral artery using u/s guidance. Position confirmed with angiography. Pre-closure with double ProGlide closure devices. The patient was heparinized systemically and ACT verified > 250 seconds.    A 14 Fr transfemoral E-sheath was introduced into the right femoral artery after progressively dilating over an Amplatz superstiff wire. An AL-1 catheter was used to direct a straight-tip exchange length wire across the native aortic valve into the left ventricle. This was exchanged out for a pigtail catheter and position was confirmed in the LV apex. Simultaneous LV and Ao pressures were recorded.  The pigtail catheter was then exchanged for an Amplatz Extra-stiff wire in the LV apex.   LVEDP= 15 mmHg  TRANSCATHETER HEART VALVE DEPLOYMENT:  An Edwards Sapien 3 THV (size 23 mm) was prepared and crimped per manufacturer's guidelines, and the proper orientation of the valve  is confirmed on the [employer] delivery system. The valve was advanced through the introducer sheath using normal technique until in an appropriate position in the abdominal aorta beyond the sheath tip. The balloon was then retracted and using the fine-tuning wheel was centered on the valve. The valve was then advanced across the aortic arch using appropriate flexion of the catheter. The valve was carefully positioned across the aortic valve annulus. The Commander catheter was retracted using normal technique. Once final position of the valve has been confirmed by angiographic assessment, the valve is deployed while temporarily holding ventilation and during rapid ventricular pacing to maintain systolic blood pressure < 50 mmHg and pulse pressure < 10 mmHg. The balloon inflation is held for >3 seconds after reaching full deployment volume. Once the balloon has fully deflated the balloon is retracted into the ascending aorta and valve function is assessed using TTE. There is felt to be a mild paravalvular leak and no central aortic insufficiency.  The patient's hemodynamic recovery following valve deployment is good.  The deployment balloon and guidewire are both removed. Echo demostrated acceptable post-procedural gradients, stable mitral valve function, and mild AI.   PROCEDURE COMPLETION:  The sheath was then removed and closure devices were completed. Protamine was administered once femoral arterial repair was complete. The temporary pacemaker, pigtail catheters and femoral sheaths were removed with a Mynx closure device placed in the artery and manual pressure used for venous hemostasis.    The patient tolerated the procedure well and is transported to the surgical intensive care in stable condition. There were no immediate intraoperative complications. All sponge instrument and needle counts are verified correct at completion of the operation.   No blood products were administered during the  operation.  The patient received a total of 60 mL of  intravenous contrast during the procedure.  Lauree Chandler MD 12/29/2021 9:40 AM

## 2021-12-29 NOTE — Discharge Instructions (Signed)

## 2021-12-29 NOTE — Progress Notes (Signed)
  Echocardiogram 2D Echocardiogram has been performed.  Janalyn Harder 12/29/2021, 9:25 AM

## 2021-12-29 NOTE — Progress Notes (Signed)
        L radial arterial line was removed by Juanetta Beets, and manual pressure was held for 12 min. Sterile gauze was applied at the site. L radial pulse was 2+, and capillary refill < 3 sec.

## 2021-12-30 ENCOUNTER — Inpatient Hospital Stay (HOSPITAL_COMMUNITY): Payer: Medicare Other

## 2021-12-30 ENCOUNTER — Encounter (HOSPITAL_COMMUNITY): Payer: Self-pay | Admitting: Cardiovascular Disease

## 2021-12-30 DIAGNOSIS — I350 Nonrheumatic aortic (valve) stenosis: Secondary | ICD-10-CM | POA: Diagnosis not present

## 2021-12-30 DIAGNOSIS — Z952 Presence of prosthetic heart valve: Secondary | ICD-10-CM

## 2021-12-30 LAB — CBC
HCT: 33.6 % — ABNORMAL LOW (ref 36.0–46.0)
Hemoglobin: 11.4 g/dL — ABNORMAL LOW (ref 12.0–15.0)
MCH: 31.8 pg (ref 26.0–34.0)
MCHC: 33.9 g/dL (ref 30.0–36.0)
MCV: 93.9 fL (ref 80.0–100.0)
Platelets: 121 10*3/uL — ABNORMAL LOW (ref 150–400)
RBC: 3.58 MIL/uL — ABNORMAL LOW (ref 3.87–5.11)
RDW: 13.1 % (ref 11.5–15.5)
WBC: 5.8 10*3/uL (ref 4.0–10.5)
nRBC: 0 % (ref 0.0–0.2)

## 2021-12-30 LAB — BASIC METABOLIC PANEL
Anion gap: 6 (ref 5–15)
BUN: 14 mg/dL (ref 8–23)
CO2: 21 mmol/L — ABNORMAL LOW (ref 22–32)
Calcium: 8.3 mg/dL — ABNORMAL LOW (ref 8.9–10.3)
Chloride: 108 mmol/L (ref 98–111)
Creatinine, Ser: 0.77 mg/dL (ref 0.44–1.00)
GFR, Estimated: >60 mL/min (ref >60)
Glucose, Bld: 113 mg/dL — ABNORMAL HIGH (ref 70–99)
Potassium: 3.8 mmol/L (ref 3.5–5.1)
Sodium: 135 mmol/L (ref 135–145)

## 2021-12-30 LAB — MAGNESIUM: Magnesium: 1.9 mg/dL (ref 1.7–2.4)

## 2021-12-30 LAB — ECHOCARDIOGRAM COMPLETE
AR max vel: 1.51 cm2
AV Area VTI: 1.48 cm2
AV Area mean vel: 1.55 cm2
AV Mean grad: 16 mmHg
AV Peak grad: 27.8 mmHg
Ao pk vel: 2.64 m/s
Area-P 1/2: 3.65 cm2
Height: 62 in
S' Lateral: 2.8 cm
Weight: 2992 oz

## 2021-12-30 MED ORDER — ONDANSETRON HCL 4 MG PO TABS: 4.0000 mg | ORAL_TABLET | Freq: Every day | ORAL | 1 refills | Status: AC | PRN

## 2021-12-30 MED ORDER — ASPIRIN 81 MG PO TBEC: 81.0000 mg | DELAYED_RELEASE_TABLET | Freq: Every day | ORAL | 2 refills | Status: AC

## 2021-12-30 MED FILL — Magnesium Sulfate Inj 50%: INTRAMUSCULAR | Qty: 10 | Status: AC

## 2021-12-30 MED FILL — Potassium Chloride Inj 2 mEq/ML: INTRAVENOUS | Qty: 40 | Status: AC

## 2021-12-30 MED FILL — Heparin Sodium (Porcine) Inj 1000 Unit/ML: Qty: 1000 | Status: AC

## 2021-12-30 NOTE — Progress Notes (Signed)
CARDIAC REHAB PHASE I   PRE:  Rate/Rhythm: 75 SR    BP: sitting 138/76    SaO2: 100 RA  MODE:  Ambulation: 120 ft   POST:  Rate/Rhythm: 85 SR    BP: sitting 141/62     SaO2: 100 RA  Pt moved to EOB and stood with min assist. Generally weak therefore used RW and gait belt assist instead of her cane.  Slow ambulation in hall, increased stability with distance. Groin stable, to recliner. Pt has RW at home. Encouraged walking as tolerated, discussed restrictions. She will be eager to return to 5 days of water aerobics when approved therefore not interested in CRPII.  [id number]  [name] CES, ACSM 12/30/2021 9:25 AM

## 2021-12-30 NOTE — Plan of Care (Signed)
  Problem: Education: Goal: Knowledge of General Education information will improve Description: Including pain rating scale, medication(s)/side effects and non-pharmacologic comfort measures Outcome: Adequate for Discharge   Problem: Health Behavior/Discharge Planning: Goal: Ability to manage health-related needs will improve Outcome: Adequate for Discharge   Problem: Clinical Measurements: Goal: Ability to maintain clinical measurements within normal limits will improve Outcome: Adequate for Discharge Goal: Will remain free from infection Outcome: Adequate for Discharge Goal: Diagnostic test results will improve Outcome: Adequate for Discharge Goal: Respiratory complications will improve Outcome: Adequate for Discharge Goal: Cardiovascular complication will be avoided Outcome: Adequate for Discharge   Problem: Coping: Goal: Level of anxiety will decrease Outcome: Adequate for Discharge   Problem: Elimination: Goal: Will not experience complications related to bowel motility Outcome: Adequate for Discharge Goal: Will not experience complications related to urinary retention Outcome: Adequate for Discharge   Problem: Safety: Goal: Ability to remain free from injury will improve Outcome: Adequate for Discharge   Problem: Pain Managment: Goal: General experience of comfort will improve Outcome: Adequate for Discharge

## 2021-12-31 ENCOUNTER — Telehealth: Payer: Self-pay | Admitting: Cardiology

## 2021-12-31 NOTE — Telephone Encounter (Signed)
  [facility]   MULTIDISCIPLINARY HEART VALVE TEAM   Patient contacted regarding discharge from [facility] on 12/30/21   Patient understands to follow up with provider Carlean Jews, PA-C at the [facility].  Patient understands discharge instructions? Yes  Patient understands medications and regimen? Yes  Patient understands to bring all medications to this visit? Yes   Georgie Chard NP-C Structural Heart Team  Pager: [phone]

## 2022-01-05 NOTE — Progress Notes (Unsigned)
HE[facility] [place]                                     Cardiology Office Note:    Date:  01/06/2022   ID:  [name], DOB 19-Jan-1946, MRN [id number]  PCP:  Maury Dus, MD  [facility] Cardiologist:  Quay Burow, MD / Dr. Angelena Form, MD & Dr. Cyndia Bent, MD (TAVR) [facility] Electrophysiologist:  None   Referring MD: Maury Dus, MD   [facility] s/p TAVR  History of Present Illness:    [name] is a 76 y.o. 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   [name] was initially referred to Dr. Gwenlyn Found 05/2021 from her PCP for the evaluation of a murmur and found to have severe aortic stenosis by echocardiogram. Given that she was rather asymptomatic, plan was for continued surveillance. By 10/2021, she was having progressive SOB with less activity therefore, she underwent R/LHC to better define coronary anatomy. Cath from 10/12/21 showed no CAD however confirmed severe aortic stenosis. She was then referred to Dr. Angelena Form for TAVR consideration.   She was evaluated by the multidisciplinary valve team and underwent successful TAVR with a 23 mm Edwards Sapien 3 THV via the TF approach on 12/29/21. Post operative echo showed EF 60%, normally functioning TAVR with a mean gradient of 16 mmHg and trivial PVL. She was continued on aspirin monotherapy.  She did have some nausea after surgery.   Today the patient presents to clinic for follow up. Here with her husband. Going back to do water aerobics. No CP. She does have some mild SOB with exertion that she is a little dissapointed hasnt resolved since TAVR. Marland Kitchen No LE edema, orthopnea or PND. No dizziness or syncope. No blood in stool or urine. No palpitations.    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 Angelena Form & Dr. Cyndia Bent   Severe aortic stenosis     Past Surgical History:  Procedure Laterality Date   ABDOMINAL HYSTERECTOMY  08/09/1981   BACK SURGERY  11/07/2004   CATARACT EXTRACTION  2022   DIAGNOSTIC LAPAROSCOPY  08/09/1974   endometriosis   INTRAOPERATIVE TRANSTHORACIC ECHOCARDIOGRAM N/A 12/29/2021   Procedure: INTRAOPERATIVE TRANSTHORACIC ECHOCARDIOGRAM;  Surgeon: Burnell Blanks, MD;  Location: [place];  Service: Open Heart Surgery;  Laterality: N/A;   JOINT REPLACEMENT Right 03/09/2010   hip   KNEE ARTHROPLASTY Right 2008   RIGHT/LEFT HEART CATH AND CORONARY ANGIOGRAPHY N/A 10/12/2021   Procedure: RIGHT/LEFT HEART CATH AND CORONARY ANGIOGRAPHY;  Surgeon: Lorretta Harp, MD;  Location: [place] [facility];  Service: Cardiovascular;  Laterality: N/A;   TONSILLECTOMY  08/09/1984   TOTAL KNEE ARTHROPLASTY Left 08/13/2013   Procedure: LEFT TOTAL KNEE ARTHROPLASTY;  Surgeon: Vickey Huger, MD;  Location: [place];  Service: Orthopedics;  Laterality: Left;   TRANSCATHETER AORTIC VALVE REPLACEMENT, TRANSFEMORAL Bilateral 12/29/2021   Procedure: Transcatheter Aortic Valve Replacement, Transfemoral USING 23MM EDWARDS SAPIEN 3 ULTRA;  Surgeon: Burnell Blanks, MD;  Location: [place];  Service: Open Heart Surgery;  Laterality: Bilateral;  Percutaneous Site    Current Medications: Current Meds  Medication Sig   acetaminophen (TYLENOL) 500 MG tablet Take 500 mg by mouth every 6 (six) hours as needed for moderate pain.   acyclovir (ZOVIRAX) 400 MG tablet Take 400 mg by mouth 2 (two) times daily as needed (Cold sore).   aspirin EC 81 MG tablet Take 1 tablet (81 mg total) by mouth daily. Swallow whole.   azithromycin (ZITHROMAX) 500 MG tablet Take 1 tablet (500 mg total) by mouth as directed. Take one tablet 1 hour before any dental work including cleanings.    brimonidine (ALPHAGAN P) 0.1 % SOLN Place 1 drop into both eyes every 12 (twelve) hours.   Calcium Carb-Cholecalciferol (CALCIUM 600 + D PO) Take 1 tablet by mouth 2 (two) times daily.   cholecalciferol (VITAMIN D3) 25 MCG (1000 UNIT) tablet Take 1,000 Units by mouth daily. Midday   docusate sodium (COLACE) 100 MG capsule Take 100 mg by mouth 2 (two) times daily.   dorzolamide-timolol (COSOPT) 22.3-6.8 MG/ML ophthalmic solution Place 1 drop into both eyes every 12 (twelve) hours.   famotidine (PEPCID) 20 MG tablet Take 20 mg by mouth daily as needed for indigestion (if take Aleve).   Flaxseed, Linseed, (FLAX SEED OIL) 1000 MG CAPS Take 1,000 mg by mouth daily.   gabapentin (NEURONTIN) 300 MG capsule Take 300 mg by mouth 3 (three) times daily as needed (pain).   Glucosamine-Chondroit-Vit C-Mn (GLUCOSAMINE 1500 COMPLEX PO) Take 1 tablet by mouth 2 (two) times daily. 1500 mg /1200 mg   guaiFENesin 200 MG tablet Take 200 mg by mouth every 8 (eight) hours as needed for cough or to loosen phlegm. [place] Brand Mucinex   latanoprost (XALATAN) 0.005 % ophthalmic solution Place 1 drop into both eyes at bedtime.   Liniments (SALONPAS EX) Place 1 patch onto the skin at bedtime as needed (back pain). Pain patch   meclizine (ANTIVERT) 25 MG tablet Take 25 mg by mouth 3 (three) times daily as needed (for motion sickness).   melatonin 5 MG TABS Take 5 mg by mouth at bedtime as needed (Sleep).   Multiple Vitamin (MULTIVITAMIN WITH MINERALS) TABS tablet Take 1 tablet by mouth daily. [place]   naproxen sodium (ALEVE) 220 MG tablet Take 220 mg by mouth daily as needed (pain).   ondansetron (ZOFRAN) 4 MG tablet Take 1 tablet (4 mg total) by mouth daily as needed for nausea or vomiting.   polyethylene glycol (MIRALAX / GLYCOLAX) packet Take 17 g by mouth daily.   Propylene Glycol (SYSTANE COMPLETE) 0.6 % SOLN Place 1 drop into both eyes as needed (dry eyes).   Psyllium (METAMUCIL PO) Take 1 capsule by mouth 2 (two)  times daily. [place]   raloxifene (EVISTA) 60 MG tablet Take 60 mg by mouth daily.   SODIUM FLUORIDE 5000 PPM 1.1 % PSTE Place 1 application onto teeth at bedtime.   trolamine salicylate (ASPERCREME) 10 % cream Apply 1 application. topically daily as needed (joint or muscle pain). With lidocaine   [DISCONTINUED] clindamycin (CLEOCIN) 150 MG capsule Take 600 mg by mouth See admin instructions. Take 1 hour before dental procedures     Allergies:   Codeine, Oxaprozin, and Keflex [cephalexin]   Social History   Socioeconomic History   Marital status: Married    Spouse name: Not on file   Number of children: 1   Years of education: Not on file   Highest education level: Not on  file  Occupational History   Occupation: Retired nurse-pediatrics  Tobacco Use   Smoking status: Never   Smokeless tobacco: Never  Vaping Use   Vaping Use: Never used  Substance and Sexual Activity   Alcohol use: Not Currently   Drug use: No   Sexual activity: Not on file  Other Topics Concern   Not on file  Social History Narrative   Not on file   Social Determinants of Health   Financial Resource Strain: Not on file  Food Insecurity: Not on file  Transportation Needs: Not on file  Physical Activity: Not on file  Stress: Not on file  Social Connections: Not on file     Family History: The patient's family history includes Atrial fibrillation in her father; Hypertension in her mother; Peripheral Artery Disease in her mother.  ROS:   Please see the history of present illness.    All other systems reviewed and are negative.  EKGs/Labs/Other Studies Reviewed:    The following studies were reviewed today:  TAVR OPERATIVE NOTE     Date of Procedure:                12/29/2021   Preoperative Diagnosis:      Severe Aortic Stenosis    Postoperative Diagnosis:    Same    Procedure:        Transcatheter Aortic Valve Replacement - Percutaneous Right Transfemoral Approach             Edwards  Sapien 3 Ultra Resilia THV (size 23 mm, model # 9755RSL, serial # [id number])              Co-Surgeons:                        [name], MD and Verne Carrow    Anesthesiologist:                  Mal Amabile   Echocardiographer:              Izora Ribas   Pre-operative Echo Findings: Severe aortic stenosis Normal left ventricular systolic function   Post-operative Echo Findings: Mild paravalvular leak Normal left ventricular systolic function  _____________    Echo 12/30/21:  Study Result  ECHOCARDIOGRAM REPORT    Patient Name:   [name] Date of Exam: 12/30/2021  Medical Rec #:  [id number]      Height:       62.0 in  Accession #:    [id number]     Weight:       187.0 lb  Date of Birth:  Apr 07, 1946      BSA:          1.858 m  Patient Age:    75 years       BP:           147/81 mmHg  Patient Gender: F              HR:           67 bpm.  Exam Location:  Inpatient   Procedure: 2D Echo, Cardiac Doppler and Color Doppler   Indications:    Post TAVR     History:        Patient has prior history of Echocardiogram examinations,  most                  recent 12/29/2021. Aortic Valve Disease.     Sonographer:  Joette Catching RCS  Referring Phys: [id number] [place]     1. The aortic valve has been replaced by a 23 mm Sapien Balve. Aortic  valve regurgitation is trivial, adjacent to the mitral valve, seen best in  the A3c and A5c views. Effective Orifice area, by VTI measures 1.48 cm.  Aortic valve mean gradient measures   16.0 mmHg. Peak gradient 30 mm Hg. DVI 0.47.   2. Left ventricular ejection fraction, by estimation, is 60 to 65%. The  left ventricle has normal function. The left ventricle has no regional  wall motion abnormalities. There is mild asymmetric left ventricular  hypertrophy of the septal segment. Left  ventricular diastolic parameters are consistent with Grade I diastolic  dysfunction (impaired relaxation).   3. Right  ventricular systolic function is normal. The right ventricular  size is normal. There is normal pulmonary artery systolic pressure. The  estimated right ventricular systolic pressure is [id number] mmHg.   4. Left atrial size was mildly dilated.   5. The mitral valve is grossly normal. No evidence of mitral valve  regurgitation. No evidence of mitral stenosis.   Comparison(s): Slight increase in aortic valve gradients, improvement in  PVL.     EKG:  EKG is ordered today.  The ekg ordered today demonstrates sinus with sinus arrhythmia HR 74  Recent Labs: 12/25/2021: ALT 18 12/30/2021: BUN 14; Creatinine, Ser 0.77; Hemoglobin 11.4; Magnesium 1.9; Platelets 121; Potassium 3.8; Sodium 135  Recent Lipid Panel No results found for: CHOL, TRIG, HDL, CHOLHDL, VLDL, LDLCALC, LDLDIRECT   Risk Assessment/Calculations:       Physical Exam:    VS:  BP 122/80   Pulse 74   Ht 5\' 2"  (1.575 m)   Wt 184 lb (83.5 kg)   BMI 33.65 kg/m     Wt Readings from Last 3 Encounters:  01/06/22 184 lb (83.5 kg)  12/30/21 187 lb (84.8 kg)  12/25/21 180 lb 12.8 oz (82 kg)     GEN:  Well nourished, well developed in no acute distress HEENT: Normal NECK: No JVD LYMPHATICS: No lymphadenopathy CARDIAC: RRR, soft flow murmurs. No rubs, gallops RESPIRATORY:  Clear to auscultation without rales, wheezing or rhonchi  ABDOMEN: Soft, non-tender, non-distended MUSCULOSKELETAL:  No edema; No deformity  SKIN: Warm and dry.  Groin sites clear without hematoma or ecchymosis  NEUROLOGIC:  Alert and oriented x 3 PSYCHIATRIC:  Normal affect   ASSESSMENT:    1. S/P TAVR (transcatheter aortic valve replacement)   2. Thyroid nodule    PLAN:    In order of problems listed above:  Severe AS s/p TAVR: doing well 1 week out from TAVR. Groin sites are healing well. ECG with no HAVB. Continue on aspirin alone. I have advised that she stop clindamycin and start azithromycin PRN for SBE prophylaxis.. She plans to defer cardiac  rehab and get back into water aerobics.  I will see her back next months for follow up and echo.  Thyroid nodule: noted on pre TAVR CT: "enlarged and heterogeneous left thyroid lobe. Recommend further evaluation with thyroid ultrasound." will discuss in the outpatient setting.     Medication Adjustments/Labs and Tests Ordered: Current medicines are reviewed at length with the patient today.  Concerns regarding medicines are outlined above.  Orders Placed This Encounter  Procedures   US Soft Tissue Head/Neck   EKG 12-Lead   Meds ordered this encounter  Medications   azithromycin (ZITHROMAX) 500 MG tablet    Sig:  Take 1 tablet (500 mg total) by mouth as directed. Take one tablet 1 hour before any dental work including cleanings.    Dispense:  6 tablet    Refill:  12    Order Specific Question:   Supervising Provider    Answer:   Sherren Mocha [id number]    Patient Instructions  Medication Instructions:  Your physician has recommended you make the following change in your medication:  STOP: clindamycin  START: azithromycin (Zithromax) 500 mg by mouth 1 hour prior to dental work  *If you need a refill on your cardiac medications before your next appointment, please call your pharmacy*   Lab Work: NONE If you have labs (blood work) drawn today and your tests are completely normal, you will receive your results only by: [place] (if you have MyChart) OR A paper copy in the mail If you have any lab test that is abnormal or we need to change your treatment, we will call you to review the results.   Testing/Procedures: NONE   Follow-Up: At [facility], you and your health needs are our priority.  As part of our continuing mission to provide you with exceptional heart care, we have created designated Provider Care Teams.  These Care Teams include your primary Cardiologist (physician) and Advanced Practice Providers (APPs -  Physician Assistants and Nurse Practitioners) who  all work together to provide you with the care you need, when you need it.   Important Information About Sugar         Signed, Angelena Form, PA-C  01/06/2022 3:09 PM    [facility]

## 2022-01-06 ENCOUNTER — Ambulatory Visit (INDEPENDENT_AMBULATORY_CARE_PROVIDER_SITE_OTHER): Payer: Medicare Other | Admitting: Physician Assistant

## 2022-01-06 VITALS — BP 122/80 | HR 74 | Ht 62.0 in | Wt 184.0 lb

## 2022-01-06 DIAGNOSIS — E041 Nontoxic single thyroid nodule: Secondary | ICD-10-CM

## 2022-01-06 DIAGNOSIS — Z952 Presence of prosthetic heart valve: Secondary | ICD-10-CM | POA: Diagnosis not present

## 2022-01-06 MED ORDER — AZITHROMYCIN 500 MG PO TABS: 500.0000 mg | ORAL_TABLET | ORAL | 12 refills | Status: AC

## 2022-01-06 NOTE — Patient Instructions (Addendum)
Medication Instructions:  Your physician has recommended you make the following change in your medication:  STOP: clindamycin  START: azithromycin (Zithromax) 500 mg by mouth 1 hour prior to dental work  *If you need a refill on your cardiac medications before your next appointment, please call your pharmacy*   Lab Work: NONE If you have labs (blood work) drawn today and your tests are completely normal, you will receive your results only by: MyChart Message (if you have MyChart) OR A paper copy in the mail If you have any lab test that is abnormal or we need to change your treatment, we will call you to review the results.   Testing/Procedures: NONE   Follow-Up: At [facility], you and your health needs are our priority.  As part of our continuing mission to provide you with exceptional heart care, we have created designated Provider Care Teams.  These Care Teams include your primary Cardiologist (physician) and Advanced Practice Providers (APPs -  Physician Assistants and Nurse Practitioners) who all work together to provide you with the care you need, when you need it.   Important Information About Sugar

## 2022-01-22 ENCOUNTER — Ambulatory Visit (HOSPITAL_COMMUNITY)
Admission: RE | Admit: 2022-01-22 | Discharge: 2022-01-22 | Disposition: A | Discharge status: Home / Self Care | Payer: Medicare Other | Source: Ambulatory Visit | Attending: Physician Assistant | Admitting: Physician Assistant

## 2022-01-22 DIAGNOSIS — E041 Nontoxic single thyroid nodule: Secondary | ICD-10-CM | POA: Diagnosis present

## 2022-01-27 ENCOUNTER — Ambulatory Visit: Payer: Medicare Other | Admitting: Cardiovascular Disease

## 2022-01-29 ENCOUNTER — Other Ambulatory Visit: Payer: Self-pay | Admitting: Physician Assistant

## 2022-01-29 ENCOUNTER — Ambulatory Visit (HOSPITAL_COMMUNITY): Payer: Medicare Other | Attending: Cardiology

## 2022-01-29 ENCOUNTER — Ambulatory Visit (INDEPENDENT_AMBULATORY_CARE_PROVIDER_SITE_OTHER): Payer: Medicare Other | Admitting: Physician Assistant

## 2022-01-29 VITALS — BP 132/78 | HR 64 | Ht 62.0 in | Wt 183.8 lb

## 2022-01-29 DIAGNOSIS — Z952 Presence of prosthetic heart valve: Secondary | ICD-10-CM

## 2022-01-29 DIAGNOSIS — E041 Nontoxic single thyroid nodule: Secondary | ICD-10-CM | POA: Diagnosis not present

## 2022-01-29 LAB — ECHOCARDIOGRAM COMPLETE
AR max vel: 1.5 cm2
AV Area VTI: 1.49 cm2
AV Area mean vel: 1.56 cm2
AV Mean grad: 21 mmHg
AV Peak grad: 36.7 mmHg
Ao pk vel: 3.03 m/s
Area-P 1/2: 2.87 cm2
P 1/2 time: 483 msec
S' Lateral: 3 cm

## 2022-03-15 ENCOUNTER — Other Ambulatory Visit: Payer: Self-pay | Admitting: Surgery

## 2022-03-15 DIAGNOSIS — E041 Nontoxic single thyroid nodule: Secondary | ICD-10-CM

## 2022-03-17 ENCOUNTER — Other Ambulatory Visit: Payer: Self-pay | Admitting: Surgery

## 2022-03-17 DIAGNOSIS — E041 Nontoxic single thyroid nodule: Secondary | ICD-10-CM

## 2022-04-13 ENCOUNTER — Other Ambulatory Visit (HOSPITAL_COMMUNITY)
Admission: RE | Admit: 2022-04-13 | Discharge: 2022-04-13 | Disposition: A | Discharge status: Home / Self Care | Payer: Medicare Other | Source: Ambulatory Visit | Attending: Surgery | Admitting: Surgery

## 2022-04-13 ENCOUNTER — Ambulatory Visit
Admission: RE | Admit: 2022-04-13 | Discharge: 2022-04-13 | Disposition: A | Discharge status: Home / Self Care | Payer: Medicare Other | Source: Ambulatory Visit | Attending: Surgery | Admitting: Surgery

## 2022-04-13 DIAGNOSIS — E041 Nontoxic single thyroid nodule: Secondary | ICD-10-CM | POA: Diagnosis present

## 2022-04-13 NOTE — Procedures (Signed)
Interventional Radiology Procedure Note  Procedure: US guided FNA of single left and single right thyroid nodule.  .  Complications: None Recommendations:  - Ok to shower tomorrow - Do not submerge for 7 days - Routine care   Signed,  Yvone Neu. Loreta Ave, DO

## 2022-04-16 LAB — CYTOLOGY - NON PAP

## 2022-04-21 ENCOUNTER — Ambulatory Visit: Payer: Medicare Other | Admitting: Cardiovascular Disease

## 2022-04-21 NOTE — Progress Notes (Signed)
Biopsy shows some atypia on one sample.  It will be sent for additional molecular genetic testing which will take about two weeks.  Will contact patient with results when available.  tmg  Darnell Level, MD [facility] practice Office: [phone]

## 2022-04-21 NOTE — Progress Notes (Signed)
Biopsy shows some atypia on one sample.  It will be sent for additional molecular genetic testing which will take about two weeks.  Will contact patient with results when available.  tmg  Ruth Wease, MD [facility] practice Office: [phone] 

## 2022-05-04 ENCOUNTER — Encounter (HOSPITAL_COMMUNITY): Payer: Self-pay

## 2022-05-12 NOTE — Progress Notes (Signed)
Good news!  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 Risk of cancer is less than 4%.  Safe to observe.  Will plan follow up in one year with USN and TSH and exam here in office.  [place], MD [facility] [place] practice Office: [phone]

## 2022-07-11 NOTE — Progress Notes (Unsigned)
No chief complaint on file.  History of Present Illness: 76 yo female with history of arthritis and severe aortic stenosis here today for follow up. She underwent TAVR on 12/2321 with placement of a 23 mm Edwards Sapien 3 valve by the femoral approach. Cardiac cath March 2023 with no evidence of CAD.  Echo June 2023 with LVEF=60-65%. AVR working well with mean gradient of 23 mmHg. Mild MR.   She is here today for follow up. The patient denies any chest pain, dyspnea, palpitations, lower extremity edema, orthopnea, PND, dizziness, near syncope or syncope.   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 Clifton James & Dr. Laneta Simmers   Severe aortic stenosis     Past Surgical History:  Procedure Laterality Date   ABDOMINAL HYSTERECTOMY  08/09/1981   BACK SURGERY  11/07/2004   CATARACT EXTRACTION  2022   DIAGNOSTIC LAPAROSCOPY  08/09/1974   endometriosis   INTRAOPERATIVE TRANSTHORACIC ECHOCARDIOGRAM N/A 12/29/2021   Procedure: INTRAOPERATIVE TRANSTHORACIC ECHOCARDIOGRAM;  Surgeon: Kathleene Hazel, MD;  Location: [facility] OR;  Service: Open Heart Surgery;  Laterality: N/A;   JOINT REPLACEMENT Right 03/09/2010   hip   KNEE ARTHROPLASTY Right 2008   RIGHT/LEFT HEART CATH AND CORONARY ANGIOGRAPHY N/A 10/12/2021   Procedure: RIGHT/LEFT HEART CATH AND CORONARY ANGIOGRAPHY;  Surgeon: Runell Gess, MD;  Location: [facility] IN[facility]IVE [facility];  Service: Cardiovascular;  Laterality: N/A;   TONSILLECTOMY  08/09/1984   TOTAL KNEE ARTHROPLASTY Left 08/13/2013   Procedure: LEFT TOTAL KNEE ARTHROPLASTY;  Surgeon: Dannielle Huh, MD;  Location: [facility] OR;  Service: Orthopedics;  Laterality: Left;   TRANSCATHETER AORTIC VALVE REPLACEMENT, TRANSFEMORAL Bilateral  12/29/2021   Procedure: Transcatheter Aortic Valve Replacement, Transfemoral USING EDWARDS SAPIEN 3 ULTRA;  Surgeon: Kathleene Hazel, MD;  Location: [facility] OR;  Service: Open Heart Surgery;  Laterality: Bilateral;  Percutaneous Site    Current Outpatient Medications  Medication Sig Dispense Refill   acetaminophen (TYLENOL) 500 MG tablet Take 500 mg by mouth every 6 (six) hours as needed for moderate pain.     acyclovir (ZOVIRAX) 400 MG tablet Take 400 mg by mouth 2 (two) times daily as needed (Cold sore).     aspirin EC 81 MG tablet Take 1 tablet (81 mg total) by mouth daily. Swallow whole.  2   azithromycin (ZITHROMAX) 500 MG tablet Take 1 tablet (500 mg total) by mouth as directed. Take one tablet 1 hour before any dental work including cleanings. 6 tablet 12   brimonidine (ALPHAGAN P) 0.1 % SOLN Place 1 drop into both eyes every 12 (twelve) hours.     Calcium Carb-Cholecalciferol (CALCIUM 600 + D PO) Take 1 tablet by mouth 2 (two) times daily.     cholecalciferol (VITAMIN D3) 25 MCG (1000 UNIT) tablet Take 1,000 Units by mouth daily. Midday     docusate sodium (COLACE) 100 MG capsule Take 100 mg by mouth 2 (two) times daily.     dorzolamide-timolol (COSOPT) 22.3-6.8 MG/ML ophthalmic solution Place 1 drop into both eyes every 12 (twelve) hours.     famotidine (PEPCID) 20 MG tablet Take 20 mg by mouth daily as needed for indigestion (if take Aleve).     Flaxseed,  Linseed, (FLAX SEED OIL) 1000 MG CAPS Take 1,000 mg by mouth daily.     gabapentin (NEURONTIN) 300 MG capsule Take 300 mg by mouth 3 (three) times daily as needed (pain).     Glucosamine-Chondroit-Vit C-Mn (GLUCOSAMINE 1500 COMPLEX PO) Take 1 tablet by mouth 2 (two) times daily. 1500 mg /1200 mg     guaiFENesin 200 MG tablet Take 200 mg by mouth every 8 (eight) hours as needed for cough or to loosen phlegm. [place] Brand Mucinex     latanoprost (XALATAN) 0.005 % ophthalmic solution Place 1 drop into both eyes at bedtime.      Liniments (SALONPAS EX) Place 1 patch onto the skin at bedtime as needed (back pain). Pain patch     meclizine (ANTIVERT) 25 MG tablet Take 25 mg by mouth 3 (three) times daily as needed (for motion sickness).     melatonin 5 MG TABS Take 5 mg by mouth at bedtime as needed (Sleep).     Multiple Vitamin (MULTIVITAMIN WITH MINERALS) TABS tablet Take 1 tablet by mouth daily. [place]     naproxen sodium (ALEVE) 220 MG tablet Take 220 mg by mouth daily as needed (pain).     ondansetron (ZOFRAN) 4 MG tablet Take 1 tablet (4 mg total) by mouth daily as needed for nausea or vomiting. 30 tablet 1   polyethylene glycol (MIRALAX / GLYCOLAX) packet Take 17 g by mouth daily.     Propylene Glycol (SYSTANE COMPLETE) 0.6 % SOLN Place 1 drop into both eyes as needed (dry eyes).     Psyllium (METAMUCIL PO) Take 1 capsule by mouth 2 (two) times daily. [place]     raloxifene (EVISTA) 60 MG tablet Take 60 mg by mouth daily.     SODIUM FLUORIDE 5000 PPM 1.1 % PSTE Place 1 application onto teeth at bedtime.     trolamine salicylate (ASPERCREME) 10 % cream Apply 1 application. topically daily as needed (joint or muscle pain). With lidocaine     No current facility-administered medications for this visit.    Allergies  Allergen Reactions   Codeine Nausea Only   Oxaprozin     Other reaction(s): nausea   Keflex [Cephalexin] Rash    Social History   Socioeconomic History   Marital status: Married    Spouse name: Not on file   Number of children: 1   Years of education: Not on file   Highest education level: Not on file  Occupational History   Occupation: Retired nurse-pediatrics  Tobacco Use   Smoking status: Never   Smokeless tobacco: Never  Vaping Use   Vaping Use: Never used  Substance and Sexual Activity   Alcohol use: Not Currently   Drug use: No   Sexual activity: Not on file  Other Topics Concern   Not on file  Social History Narrative   Not on file   Social Determinants of Health    Financial Resource Strain: Not on file  Food Insecurity: Not on file  Transportation Needs: Not on file  Physical Activity: Not on file  Stress: Not on file  Social Connections: Not on file  Intimate Partner Violence: Not on file    Family History  Problem Relation Age of Onset   Hypertension Mother    Peripheral Artery Disease Mother    Atrial fibrillation Father     Review of Systems:  As stated in the HPI and otherwise negative.   There were no vitals taken for this visit.  Physical Examination: General:  Well developed, well nourished, NAD  HEENT: OP clear, mucus membranes moist  SKIN: warm, dry. No rashes. Neuro: No focal deficits  Musculoskeletal: Muscle strength 5/5 all ext  Psychiatric: Mood and affect normal  Neck: No JVD, no carotid bruits, no thyromegaly, no lymphadenopathy.  Lungs:Clear bilaterally, no wheezes, rhonci, crackles Cardiovascular: Regular rate and rhythm. No murmurs, gallops or rubs. Abdomen:Soft. Bowel sounds present. Non-tender.  Extremities: No lower extremity edema. Pulses are 2 + in the bilateral DP/PT.  EKG:  EKG is not *** ordered today. The ekg from 10/08/21 is reviewed and shows  Echo June 2023:  Recent Labs: 12/25/2021: ALT 18 12/30/2021: BUN 14; Creatinine, Ser 0.77; Hemoglobin 11.4; Magnesium 1.9; Platelets 121; Potassium 3.8; Sodium 135   Lipid Panel No results found for: "CHOL", "TRIG", "HDL", "CHOLHDL", "VLDL", "LDLCALC", "LDLDIRECT"   Wt Readings from Last 3 Encounters:  01/29/22 183 lb 12.8 oz (83.4 kg)  01/06/22 184 lb (83.5 kg)  12/30/21 187 lb (84.8 kg)    Assessment and Plan:   1. Severe Aortic Valve Stenosis: She is s/p TAVR in May 2023. She is doing well. Continue ASA. She will use SBE prophylaxis as needed.   Labs/ tests ordered today include:  No orders of the defined types were placed in this encounter.  Follow up with K. Janee Morn, PA-C as planned in May 2024 for one year TAVR follow up and then with me in May  2025 for ongoing care.    Signed, Verne Carrow, MD 07/11/2022 11:57 AM    [facility] [address] [place], [place], [place]  [address] Phone: [phone]; Fax: [phone]

## 2022-07-12 ENCOUNTER — Encounter: Payer: Self-pay | Admitting: Cardiovascular Disease

## 2022-07-12 ENCOUNTER — Ambulatory Visit: Payer: Medicare Other | Attending: Cardiovascular Disease | Admitting: Cardiovascular Disease

## 2022-07-12 VITALS — BP 128/72 | HR 66 | Ht 62.0 in | Wt 186.8 lb

## 2022-07-12 DIAGNOSIS — I350 Nonrheumatic aortic (valve) stenosis: Secondary | ICD-10-CM

## 2022-07-12 DIAGNOSIS — Z952 Presence of prosthetic heart valve: Secondary | ICD-10-CM | POA: Diagnosis not present

## 2022-07-12 NOTE — Patient Instructions (Signed)
Medication Instructions:  No changes *If you need a refill on your cardiac medications before your next appointment, please call your pharmacy*   Lab Work: none If you have labs (blood work) drawn today and your tests are completely normal, you will receive your results only by: MyChart Message (if you have MyChart) OR A paper copy in the mail If you have any lab test that is abnormal or we need to change your treatment, we will call you to review the results.   Testing/Procedures: none   Follow-Up: At [facility], you and your health needs are our priority.  As part of our continuing mission to provide you with exceptional heart care, we have created designated Provider Care Teams.  These Care Teams include your primary Cardiologist (physician) and Advanced Practice Providers (APPs -  Physician Assistants and Nurse Practitioners) who all work together to provide you with the care you need, when you need it.   Your next appointment:   12 month(s)  The format for your next appointment:   In Person  Provider:   Nanetta Batty, MD      Important Information About Sugar

## 2022-12-28 NOTE — Progress Notes (Unsigned)
[facility]   MULTIDISCIPLINARY HEART VALVE CLINIC                                     Cardiology Office Note:    Date:  12/29/2022   ID:  [name], DOB 1945-11-02, MRN [id number]  PCP:  Elias Else, MD (Inactive)  [facility] Cardiologist:  Nanetta Batty, MD / Dr. Clifton James, MD & Dr. Laneta Simmers, MD (TAVR) [facility] Electrophysiologist:  None   Referring MD: Elias Else, MD   1 year s/p TAVR  History of Present Illness:    [name] is a 77 y.o. 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   [name] was initially referred to Dr. Allyson Sabal 05/2021 from her PCP for the evaluation of a murmur and found to have severe aortic stenosis by echocardiogram. Given that she was rather asymptomatic, plan was for continued surveillance. By 10/2021, she was having progressive SOB with less activity therefore, she underwent R/LHC to better define coronary anatomy. Cath from 10/12/21 showed no CAD however confirmed severe aortic stenosis. She was then referred to Dr. Clifton James for TAVR consideration.   She was evaluated by the multidisciplinary valve team and underwent successful TAVR with a 23 mm Edwards Sapien 3 THV via the TF approach on 12/29/21. Post operative echo showed EF 60%, normally functioning TAVR with a mean gradient of 16 mmHg and trivial PVL. She was continued on aspirin monotherapy. 1 month echo showed EF 60%, normally functioning TAVR with a mean gradient of 21 mm hg (more elevated than expected but similar to gradients on post deployment intraoperative echo) and mild PVL.  Today the patient presents to clinic for follow up. [facility]. Does get tired out from time to time but attributes this to her age. No CP or SOB. No LE edema, orthopnea or PND. No dizziness or syncope. No blood in stool or urine. No  palpitations.    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 Clifton James & Dr. Laneta Simmers   Severe aortic stenosis     Past Surgical History:  Procedure Laterality Date   ABDOMINAL HYSTERECTOMY  08/09/1981   BACK SURGERY  11/07/2004   CATARACT EXTRACTION  2022   DIAGNOSTIC LAPAROSCOPY  08/09/1974   endometriosis   INTRAOPERATIVE TRANSTHORACIC ECHOCARDIOGRAM N/A 12/29/2021   Procedure: INTRAOPERATIVE TRANSTHORACIC ECHOCARDIOGRAM;  Surgeon: Kathleene Hazel, MD;  Location: [facility] OR;  Service: Open Heart Surgery;  Laterality: N/A;   JOINT REPLACEMENT Right 03/09/2010   hip   KNEE ARTHROPLASTY Right 2008   RIGHT/LEFT HEART CATH AND CORONARY ANGIOGRAPHY N/A 10/12/2021   Procedure: RIGHT/LEFT HEART CATH AND CORONARY ANGIOGRAPHY;  Surgeon: Runell Gess, MD;  Location: [facility] INVASIVE CV LAB;  Service: Cardiovascular;  Laterality: N/A;   TONSILLECTOMY  08/09/1984   TOTAL KNEE ARTHROPLASTY Left 08/13/2013   Procedure: LEFT TOTAL KNEE ARTHROPLASTY;  Surgeon: Dannielle Huh, MD;  Location: [facility] OR;  Service: Orthopedics;  Laterality: Left;   TRANSCATHETER AORTIC VALVE REPLACEMENT, TRANSFEMORAL Bilateral 12/29/2021  Procedure: Transcatheter Aortic Valve Replacement, Transfemoral USING EDWARDS SAPIEN 3 ULTRA;  Surgeon: Kathleene Hazel, MD;  Location: [facility] OR;  Service: Open Heart Surgery;  Laterality: Bilateral;  Percutaneous Site    Current Medications: Current Meds  Medication Sig   acetaminophen (TYLENOL) 500 MG tablet Take 500 mg by mouth every 6 (six) hours as needed for moderate pain.   acyclovir (ZOVIRAX) 400 MG tablet Take 400 mg by mouth 2 (two) times daily as needed (Cold sore).   aspirin EC 81 MG tablet Take 1 tablet (81 mg total) by mouth daily. Swallow whole.    azithromycin (ZITHROMAX) 500 MG tablet Take 1 tablet (500 mg total) by mouth as directed. Take one tablet 1 hour before any dental work including cleanings.   brimonidine (ALPHAGAN P) 0.1 % SOLN Place 1 drop into both eyes every 12 (twelve) hours.   Calcium Carb-Cholecalciferol (CALCIUM 600 + D PO) Take 1 tablet by mouth 2 (two) times daily.   cholecalciferol (VITAMIN D3) 25 MCG (1000 UNIT) tablet Take 1,000 Units by mouth daily. Midday   docusate sodium (COLACE) 100 MG capsule Take 100 mg by mouth 2 (two) times daily.   dorzolamide-timolol (COSOPT) 22.3-6.8 MG/ML ophthalmic solution Place 1 drop into both eyes every 12 (twelve) hours.   famotidine (PEPCID) 20 MG tablet Take 20 mg by mouth daily as needed for indigestion (if take Aleve).   Flaxseed, Linseed, (FLAX SEED OIL) 1000 MG CAPS Take 1,000 mg by mouth daily.   Glucosamine-Chondroit-Vit C-Mn (GLUCOSAMINE 1500 COMPLEX PO) Take 1 tablet by mouth 2 (two) times daily. 1500 mg /1200 mg   guaiFENesin 200 MG tablet Take 200 mg by mouth every 8 (eight) hours as needed for cough or to loosen phlegm. [place] Brand Mucinex   latanoprost (XALATAN) 0.005 % ophthalmic solution Place 1 drop into both eyes at bedtime.   Liniments (SALONPAS EX) Place 1 patch onto the skin at bedtime as needed (back pain). Pain patch   meclizine (ANTIVERT) 25 MG tablet Take 25 mg by mouth 3 (three) times daily as needed (for motion sickness).   melatonin 5 MG TABS Take 5 mg by mouth at bedtime as needed (Sleep).   Multiple Vitamin (MULTIVITAMIN WITH MINERALS) TABS tablet Take 1 tablet by mouth daily. [place]   naproxen sodium (ALEVE) 220 MG tablet Take 220 mg by mouth daily as needed (pain).   ondansetron (ZOFRAN) 4 MG tablet Take 1 tablet (4 mg total) by mouth daily as needed for nausea or vomiting.   polyethylene glycol (MIRALAX / GLYCOLAX) packet Take 17 g by mouth daily.   Propylene Glycol (SYSTANE COMPLETE) 0.6 % SOLN Place 1 drop into both eyes as needed (dry eyes).    Psyllium (METAMUCIL PO) Take 1 capsule by mouth 2 (two) times daily. [place]   raloxifene (EVISTA) 60 MG tablet Take 60 mg by mouth daily.   SODIUM FLUORIDE 5000 PPM 1.1 % PSTE Place 1 application onto teeth at bedtime.   trolamine salicylate (ASPERCREME) 10 % cream Apply 1 application. topically daily as needed (joint or muscle pain). With lidocaine     Allergies:   Codeine, Oxaprozin, and Keflex [cephalexin]   Social History   Socioeconomic History   Marital status: Married    Spouse name: Not on file   Number of children: 1   Years of education: Not on file   Highest education level: Not on file  Occupational History   Occupation: Retired nurse-pediatrics  Tobacco Use   Smoking status: Never  Smokeless tobacco: Never  Vaping Use   Vaping Use: Never used  Substance and Sexual Activity   Alcohol use: Not Currently   Drug use: No   Sexual activity: Not on file  Other Topics Concern   Not on file  Social History Narrative   Not on file   Social Determinants of Health   Financial Resource Strain: Not on file  Food Insecurity: Not on file  Transportation Needs: Not on file  Physical Activity: Not on file  Stress: Not on file  Social Connections: Not on file     Family History: The patient's family history includes Atrial fibrillation in her father; Hypertension in her mother; Peripheral Artery Disease in her mother.  ROS:   Please see the history of present illness.    All other systems reviewed and are negative.  EKGs/Labs/Other Studies Reviewed:    The following studies were reviewed today:  TAVR OPERATIVE NOTE     Date of Procedure:                12/29/2021   Preoperative Diagnosis:      Severe Aortic Stenosis    Postoperative Diagnosis:    Same    Procedure:        Transcatheter Aortic Valve Replacement - Percutaneous Right Transfemoral Approach             Edwards Sapien 3 Ultra Resilia THV (size 23 mm, model # 9755RSL, serial # [id number])               Co-Surgeons:                        [name], MD and Verne Carrow    Anesthesiologist:                  Mal Amabile   Echocardiographer:              Izora Ribas   Pre-operative Echo Findings: Severe aortic stenosis Normal left ventricular systolic function   Post-operative Echo Findings: Mild paravalvular leak Normal left ventricular systolic function  _____________    Echo 12/30/21:  Study Result  ECHOCARDIOGRAM REPORT    Patient Name:   [name] Date of Exam: 12/30/2021  Medical Rec #:  [id number]      Height:       62.0 in  Accession #:    [id number]     Weight:       187.0 lb  Date of Birth:  02-20-1946      BSA:          1.858 m  Patient Age:    75 years       BP:           147/81 mmHg  Patient Gender: F              HR:           67 bpm.  Exam Location:  Inpatient   Procedure: 2D Echo, Cardiac Doppler and Color Doppler   Indications:    Post TAVR     History:        Patient has prior history of Echocardiogram examinations,  most                  recent 12/29/2021. Aortic Valve Disease.     Sonographer:    Rodrigo Ran RCS  Referring Phys: [id number] Joangel Vanosdol R Tania Perrott   IMPRESSIONS     1.  The aortic valve has been replaced by a 23 mm Sapien Balve. Aortic  valve regurgitation is trivial, adjacent to the mitral valve, seen best in  the A3c and A5c views. Effective Orifice area, by VTI measures 1.48 cm.  Aortic valve mean gradient measures   16.0 mmHg. Peak gradient 30 mm Hg. DVI 0.47.   2. Left ventricular ejection fraction, by estimation, is 60 to 65%. The  left ventricle has normal function. The left ventricle has no regional  wall motion abnormalities. There is mild asymmetric left ventricular  hypertrophy of the septal segment. Left  ventricular diastolic parameters are consistent with Grade I diastolic  dysfunction (impaired relaxation).   3. Right ventricular systolic function is normal. The right ventricular  size is normal.  There is normal pulmonary artery systolic pressure. The  estimated right ventricular systolic pressure is 28.2 mmHg.   4. Left atrial size was mildly dilated.   5. The mitral valve is grossly normal. No evidence of mitral valve  regurgitation. No evidence of mitral stenosis.   Comparison(s): Slight increase in aortic valve gradients, improvement in  PVL.     __________________________  Echo 01/29/22 IMPRESSIONS   1. Left ventricular ejection fraction, by estimation, is 60 to 65%. The  left ventricle has normal function. The left ventricle has no regional  wall motion abnormalities. Left ventricular diastolic parameters are  consistent with Grade I diastolic  dysfunction (impaired relaxation). The average left ventricular global  longitudinal strain is -23.0 %. The global longitudinal strain is normal.   2. Right ventricular systolic function is normal. The right ventricular  size is mildly enlarged. There is mildly elevated pulmonary artery  systolic pressure.   3. Left atrial size was moderately dilated.   4. Right atrial size was moderately dilated.   5. The mitral valve is degenerative. Mild mitral valve regurgitation. No  evidence of mitral stenosis.   6. TAVR gradient slightly higher than expected. Mild perivalvular AI. The  aortic valve is normal in structure. Aortic valve regurgitation is mild.  No aortic stenosis is present. There is a 23 mm Sapien prosthetic (TAVR)  valve present in the aortic  position. Procedure Date: 12/29/21. Aortic regurgitation PHT measures 483  msec. Aortic valve area, by VTI measures 1.49 cm. Aortic valve mean  gradient measures 21.0 mmHg. Aortic valve Vmax measures 3.03 m/s.   7. The inferior vena cava is dilated in size with <50% respiratory  variability, suggesting right atrial pressure of 15 mmHg.   Comparison(s): 12/30/21 EF 60-65%. AV mean PG, peak PG.  Trivial AI.    ______________________   Echo 12/29/22 IMPRESSIONS   1. Left ventricular ejection fraction, by estimation, is 60 to 65%. The left ventricle has normal function. The left ventricle has no regional wall motion abnormalities. There is mild left ventricular hypertrophy. Left ventricular diastolic parameters  are indeterminate. The average left ventricular global longitudinal strain is -21.3 %.  2. Right ventricular systolic function is normal. The right ventricular size is normal. There is normal pulmonary artery systolic pressure. The estimated right ventricular systolic pressure is 23.8 mmHg.  3. The mitral valve is normal in structure. Trivial mitral valve regurgitation. No evidence of mitral stenosis.  4. Tricuspid valve regurgitation is mild to moderate.  5. The aortic valve has been repaired/replaced. Aortic valve regurgitation is mild to moderate. There is a 23 mm Sapien prosthetic (TAVR) valve present in the aortic position. Procedure Date: 12/29/2021. Vmax 3.0 m/s, MG , EOA 1.0 cm^2, DI  0.32.  Mild to moderate PVL.  6. The inferior vena cava is normal in size with greater than 50% respiratory variability, suggesting right atrial pressure of 3 mmHg.    EKG:  EKG is NOT ordered today.   Recent Labs: 12/30/2021: BUN 14; Creatinine, Ser 0.77; Hemoglobin 11.4; Magnesium 1.9; Platelets 121; Potassium 3.8; Sodium 135  Recent Lipid Panel No results found for: "CHOL", "TRIG", "HDL", "CHOLHDL", "VLDL", "LDLCALC", "LDLDIRECT"   Risk Assessment/Calculations:       Physical Exam:    VS:  BP (!) 146/88   Pulse (!) 59   Ht 5\' 2"  (1.575 m)   Wt 189 lb 6.4 oz (85.9 kg)   SpO2 97%   BMI 34.64 kg/m     Wt Readings from Last 3 Encounters:  12/29/22 189 lb 6.4 oz (85.9 kg)  07/12/22 186 lb 12.8 oz (84.7 kg)  01/29/22 183 lb 12.8 oz (83.4 kg)     GEN:  Well nourished, well developed in no acute distress HEENT: Normal NECK: No JVD LYMPHATICS: No lymphadenopathy CARDIAC: RRR, soft flow murmurs. No rubs, gallops RESPIRATORY:  Clear to  auscultation without rales, wheezing or rhonchi  ABDOMEN: Soft, non-tender, non-distended MUSCULOSKELETAL:  No edema; No deformity  SKIN: Warm and dry.  NEUROLOGIC:  Alert and oriented x 3 PSYCHIATRIC:  Normal affect   ASSESSMENT:    1. S/P TAVR (transcatheter aortic valve replacement)    PLAN:    In order of problems listed above:  Severe AS s/p TAVR: echo today shows EF 60%, normally functioning TAVR with a mean gradient of 19 mm hg and mild-mod PVL. Mean gradient has been stable since deployment but PVL worsened to moderate. Continue to monitor for now. She has NYHA class II symptoms. She has azithromycin PRN for SBE prophylaxis. Continue aspirin 81mg  daily. Will continue long term follow up with Dr. Allyson Sabal.    Medication Adjustments/Labs and Tests Ordered: Current medicines are reviewed at length with the patient today.  Concerns regarding medicines are outlined above.  No orders of the defined types were placed in this encounter.  No orders of the defined types were placed in this encounter.   Patient Instructions  Medication Instructions:  Your physician recommends that you continue on your current medications as directed. Please refer to the Current Medication list given to you today.  *If you need a refill on your cardiac medications before your next appointment, please call your pharmacy*   Lab Work: NONE If you have labs (blood work) drawn today and your tests are completely normal, you will receive your results only by: MyChart Message (if you have MyChart) OR A paper copy in the mail If you have any lab test that is abnormal or we need to change your treatment, we will call you to review the results.   Testing/Procedures: NONE   Follow-Up: At [facility], you and your health needs are our priority.  As part of our continuing mission to provide you with exceptional heart care, we have created designated Provider Care Teams.  These Care Teams include  your primary Cardiologist (physician) and Advanced Practice Providers (APPs -  Physician Assistants and Nurse Practitioners) who all work together to provide you with the care you need, when you need it.  We recommend signing up for the patient portal called "MyChart".  Sign up information is provided on this After Visit Summary.  MyChart is used to connect with patients for Virtual Visits (Telemedicine).  Patients are able to view lab/test  results, encounter notes, upcoming appointments, etc.  Non-urgent messages can be sent to your provider as well.   To learn more about what you can do with MyChart, go to [email or web address].    Your next appointment:   KEEP SCHEDULED FOLLOW   Signed, Cline Crock, PA-C  12/29/2022 3:35 PM    [facility]

## 2022-12-29 ENCOUNTER — Ambulatory Visit (HOSPITAL_BASED_OUTPATIENT_CLINIC_OR_DEPARTMENT_OTHER): Payer: Medicare Other

## 2022-12-29 ENCOUNTER — Ambulatory Visit: Payer: Medicare Other | Attending: Physician Assistant | Admitting: Physician Assistant

## 2022-12-29 VITALS — BP 146/88 | HR 59 | Ht 62.0 in | Wt 189.4 lb

## 2022-12-29 DIAGNOSIS — Z952 Presence of prosthetic heart valve: Secondary | ICD-10-CM

## 2022-12-29 DIAGNOSIS — E041 Nontoxic single thyroid nodule: Secondary | ICD-10-CM

## 2022-12-29 LAB — ECHOCARDIOGRAM COMPLETE
AR max vel: 0.87 cm2
AV Area VTI: 0.93 cm2
AV Area mean vel: 0.85 cm2
AV Mean grad: 19 mmHg
AV Peak grad: 34.8 mmHg
Ao pk vel: 2.95 m/s
Area-P 1/2: 3.53 cm2
P 1/2 time: 815 msec
S' Lateral: 3.1 cm

## 2022-12-29 NOTE — Patient Instructions (Signed)
Medication Instructions:  Your physician recommends that you continue on your current medications as directed. Please refer to the Current Medication list given to you today.  *If you need a refill on your cardiac medications before your next appointment, please call your pharmacy*   Lab Work: NONE If you have labs (blood work) drawn today and your tests are completely normal, you will receive your results only by: MyChart Message (if you have MyChart) OR A paper copy in the mail If you have any lab test that is abnormal or we need to change your treatment, we will call you to review the results.   Testing/Procedures: NONE   Follow-Up: At [facility], you and your health needs are our priority.  As part of our continuing mission to provide you with exceptional heart care, we have created designated Provider Care Teams.  These Care Teams include your primary Cardiologist (physician) and Advanced Practice Providers (APPs -  Physician Assistants and Nurse Practitioners) who all work together to provide you with the care you need, when you need it.  We recommend signing up for the patient portal called "MyChart".  Sign up information is provided on this After Visit Summary.  MyChart is used to connect with patients for Virtual Visits (Telemedicine).  Patients are able to view lab/test results, encounter notes, upcoming appointments, etc.  Non-urgent messages can be sent to your provider as well.   To learn more about what you can do with MyChart, go to [email or web address].    Your next appointment:   KEEP SCHEDULED FOLLOW

## 2023-03-23 ENCOUNTER — Other Ambulatory Visit: Payer: Self-pay | Admitting: Surgery

## 2023-03-23 DIAGNOSIS — E042 Nontoxic multinodular goiter: Secondary | ICD-10-CM

## 2023-03-29 ENCOUNTER — Ambulatory Visit: Admission: RE | Admit: 2023-03-29 | Payer: Medicare Other | Source: Ambulatory Visit

## 2023-03-29 DIAGNOSIS — E042 Nontoxic multinodular goiter: Secondary | ICD-10-CM

## 2023-08-01 ENCOUNTER — Ambulatory Visit: Payer: Medicare Other | Attending: Cardiovascular Disease | Admitting: Cardiovascular Disease

## 2023-08-01 ENCOUNTER — Encounter: Payer: Self-pay | Admitting: Cardiovascular Disease

## 2023-08-01 VITALS — BP 120/80 | HR 69 | Ht 62.0 in | Wt 193.4 lb

## 2023-08-01 DIAGNOSIS — I350 Nonrheumatic aortic (valve) stenosis: Secondary | ICD-10-CM | POA: Diagnosis not present

## 2023-08-01 DIAGNOSIS — Z952 Presence of prosthetic heart valve: Secondary | ICD-10-CM

## 2023-08-01 NOTE — Patient Instructions (Signed)
Medication Instructions:  Your physician recommends that you continue on your current medications as directed. Please refer to the Current Medication list given to you today.  *If you need a refill on your cardiac medications before your next appointment, please call your pharmacy*   Testing/Procedures: Your physician has requested that you have an echocardiogram. Echocardiography is a painless test that uses sound waves to create images of your heart. It provides your doctor with information about the size and shape of your heart and how well your heart's chambers and valves are working. This procedure takes approximately one hour. There are no restrictions for this procedure. Please do NOT wear cologne, perfume, aftershave, or lotions (deodorant is allowed). Please arrive 15 minutes prior to your appointment time. **To do in May 2025**  Please note: We ask at that you not bring children with you during ultrasound (echo/ vascular) testing. Due to room size and safety concerns, children are not allowed in the ultrasound rooms during exams. Our front office staff cannot provide observation of children in our lobby area while testing is being conducted. An adult accompanying a patient to their appointment will only be allowed in the ultrasound room at the discretion of the ultrasound technician under special circumstances. We apologize for any inconvenience.    Follow-Up: At [facility], you and your health needs are our priority.  As part of our continuing mission to provide you with exceptional heart care, we have created designated Provider Care Teams.  These Care Teams include your primary Cardiologist (physician) and Advanced Practice Providers (APPs -  Physician Assistants and Nurse Practitioners) who all work together to provide you with the care you need, when you need it.  We recommend signing up for the patient portal called "MyChart".  Sign up information is provided on this  After Visit Summary.  MyChart is used to connect with patients for Virtual Visits (Telemedicine).  Patients are able to view lab/test results, encounter notes, upcoming appointments, etc.  Non-urgent messages can be sent to your provider as well.   To learn more about what you can do with MyChart, go to [email or web address].    Your next appointment:   12 month(s)  Provider:   Nanetta Batty, MD

## 2023-08-01 NOTE — Assessment & Plan Note (Signed)
History of severe aortic stenosis status post TAVR procedure 12/29/2021 by Drs. McAlhany and Bartle using an Edwards SAPIEN 3 THV 23 mm valve.  Most recent echo performed 12/29/2022 revealed normal LV systolic function with moderate perivalvular leak which has been followed closely by 2D echo annually.  She feels clinically improved since the procedure.  She did have normal coronary arteries at the time of right left heart cath prior to the procedure.

## 2023-08-01 NOTE — Progress Notes (Signed)
08/01/2023 ADAOBI BIAGIONI   11/02/1945  [id number]  Primary Physician Ruth Else, MD Primary Cardiologist: Ruth Gess MD [name], [employer], [place]  HPI:  [name] is a 77 y.o.  mildly overweight married Caucasian female mother of one 49 year old son, grandmother of 2 grandchildren who I last saw in the office 10/27/2021.  She is accompanied by her husband [name] today.  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  She is referred by her PCP, Dr. Tally Joe, for evaluation of severe aortic stenosis.  She apparently was in a RSV trial and had one of the physicians auscultate a murmur.  This was confirmed by her PCP who ordered a 2D echo at their practice which was notable for severe aortic stenosis.  She has minimal symptoms except for mild dyspnea when she walks up 2 sets of stairs in her 3 level townhome.  She denies chest pain or dizziness.  She has no other cardiac risk factors.  She is never had a heart attack or stroke.  There is no family history for heart disease.  She has noticed increasing dyspnea with less exertion as well as more fatigue.  She denies chest pain.  We discussed performing right left heart cath to define her anatomy and physiology in anticipation of aortic valve replacement.  I performed outpatient right and left heart cath on her 10/12/2021 through through the right radial/brachial approach.  I demonstrated normal coronary arteries and moderate to severe aortic stenosis with a valve area of 1 cm with a valve index of 0.55 cm/m and a peak gradient of 40 mmHg.   I referred her to the multidisciplinary structural clinic who evaluated her for TAVR.  She ultimately had TAVR procedure performed 12/29/2021 by Drs. McAlhany and Bartle using an Edwards SAPIEN 3 THV 23 mm bioprosthetic valve.  She felt clinically improved after that.  Her most recent 2D echo performed 12/29/2022 revealed normal LV systolic function with moderate perivalvular leak  which is being followed carefully by 2D echo.  She does say that she feels clinically improved since the procedure.  She and her husband [name] are anticipating move into [facility] independent living in the next 6 months.   Current Meds  Medication Sig   acetaminophen (TYLENOL) 500 MG tablet Take 500 mg by mouth every 6 (six) hours as needed for moderate pain.   acyclovir (ZOVIRAX) 400 MG tablet Take 400 mg by mouth 2 (two) times daily as needed (Cold sore).   azithromycin (ZITHROMAX) 500 MG tablet Take 1 tablet (500 mg total) by mouth as directed. Take one tablet 1 hour before any dental work including cleanings.   brimonidine (ALPHAGAN P) 0.1 % SOLN Place 1 drop into both eyes every 12 (twelve) hours.   Calcium Carb-Cholecalciferol (CALCIUM 600 + D PO) Take 1 tablet by mouth 2 (two) times daily.   cholecalciferol (VITAMIN D3) 25 MCG (1000 UNIT) tablet Take 1,000 Units by mouth daily. Midday   docusate sodium (COLACE) 100 MG capsule Take 100 mg by mouth 2 (two) times daily.   dorzolamide-timolol (COSOPT) 22.3-6.8 MG/ML ophthalmic solution Place 1 drop into both eyes every 12 (twelve) hours.   famotidine (PEPCID) 20 MG tablet Take 20 mg by mouth daily as needed for indigestion (if take Aleve).   Flaxseed, Linseed, (FLAX SEED OIL) 1000 MG CAPS Take 1,000 mg by mouth daily.   Glucosamine-Chondroit-Vit C-Mn (GLUCOSAMINE 1500 COMPLEX PO) Take 1 tablet by  mouth 2 (two) times daily. 1500 mg /1200 mg   guaiFENesin 200 MG tablet Take 200 mg by mouth every 8 (eight) hours as needed for cough or to loosen phlegm. [place] Brand Mucinex   latanoprost (XALATAN) 0.005 % ophthalmic solution Place 1 drop into both eyes at bedtime.   Liniments (SALONPAS EX) Place 1 patch onto the skin at bedtime as needed (back pain). Pain patch   meclizine (ANTIVERT) 25 MG tablet Take 25 mg by mouth 3 (three) times daily as needed (for motion sickness).   melatonin 5 MG TABS Take 5 mg by mouth at bedtime as needed (Sleep).    Multiple Vitamin (MULTIVITAMIN WITH MINERALS) TABS tablet Take 1 tablet by mouth daily. [place]   naproxen sodium (ALEVE) 220 MG tablet Take 220 mg by mouth daily as needed (pain).   polyethylene glycol (MIRALAX / GLYCOLAX) packet Take 17 g by mouth daily.   Propylene Glycol (SYSTANE COMPLETE) 0.6 % SOLN Place 1 drop into both eyes as needed (dry eyes).   Psyllium (METAMUCIL PO) Take 1 capsule by mouth 2 (two) times daily. [place]   raloxifene (EVISTA) 60 MG tablet Take 60 mg by mouth daily.   SODIUM FLUORIDE 5000 PPM 1.1 % PSTE Place 1 application onto teeth at bedtime.   trolamine salicylate (ASPERCREME) 10 % cream Apply 1 application. topically daily as needed (joint or muscle pain). With lidocaine     Allergies  Allergen Reactions   Codeine Nausea Only   Oxaprozin     Other reaction(s): nausea   Keflex [Cephalexin] Rash    Social History   Socioeconomic History   Marital status: Married    Spouse name: Not on file   Number of children: 1   Years of education: Not on file   Highest education level: Not on file  Occupational History   Occupation: Retired nurse-pediatrics  Tobacco Use   Smoking status: Never   Smokeless tobacco: Never  Vaping Use   Vaping status: Never Used  Substance and Sexual Activity   Alcohol use: Not Currently   Drug use: No   Sexual activity: Not on file  Other Topics Concern   Not on file  Social History Narrative   Not on file   Social Drivers of Health   Financial Resource Strain: Not on file  Food Insecurity: Not on file  Transportation Needs: Not on file  Physical Activity: Not on file  Stress: Not on file  Social Connections: Not on file  Intimate Partner Violence: Not on file     Review of Systems: General: negative for chills, fever, night sweats or weight changes.  Cardiovascular: negative for chest pain, dyspnea on exertion, edema, orthopnea, palpitations, paroxysmal nocturnal dyspnea or shortness of breath Dermatological:  negative for rash Respiratory: negative for cough or wheezing Urologic: negative for hematuria Abdominal: negative for nausea, vomiting, diarrhea, bright red blood per rectum, melena, or hematemesis Neurologic: negative for visual changes, syncope, or dizziness All other systems reviewed and are otherwise negative except as noted above.    Blood pressure 120/80, pulse 69, height 5\' 2"  (1.575 m), weight 193 lb 6.4 oz (87.7 kg), SpO2 95%.  General appearance: alert and no distress Neck: no adenopathy, no JVD, supple, symmetrical, trachea midline, thyroid not enlarged, symmetric, no tenderness/mass/nodules, and soft bilateral carotid bruits versus transmitted murmur Lungs: clear to auscultation bilaterally Heart: Soft outflow tract murmur consistent with aortic stenosis Extremities: extremities normal, atraumatic, no cyanosis or edema Pulses: 2+ and symmetric Skin: Skin color, texture, turgor  normal. No rashes or lesions Neurologic: Grossly normal  EKG EKG Interpretation Date/Time:  Monday August 01 2023 09:54:29 EST Ventricular Rate:  60 PR Interval:  138 QRS Duration:  90 QT Interval:  384 QTC Calculation: 384 R Axis:   8  Text Interpretation: Normal sinus rhythm with sinus arrhythmia Normal ECG When compared with ECG of 30-Dec-2021 04:56, No significant change was found Confirmed by Nanetta Batty [phone]) on 08/01/2023 10:16:54 AM    ASSESSMENT AND PLAN:   Severe aortic stenosis History of severe aortic stenosis status post TAVR procedure 12/29/2021 by Drs. McAlhany and Bartle using an Edwards SAPIEN 3 THV 23 mm valve.  Most recent echo performed 12/29/2022 revealed normal LV systolic function with moderate perivalvular leak which has been followed closely by 2D echo annually.  She feels clinically improved since the procedure.  She did have normal coronary arteries at the time of right left heart cath prior to the procedure.     Ruth Gess MD FACP,FACC,FAHA,  [facility] 08/01/2023 10:24 AM

## 2023-09-07 ENCOUNTER — Telehealth: Payer: Self-pay | Admitting: Cardiovascular Disease

## 2023-09-07 NOTE — Telephone Encounter (Signed)
Received call from PA [employer] states:   -patient seen in OV today, patient reported persisted SOB   -patient current recovering from ILI   -at the end of visit patient had another episode of SOB while walking to car   -due to patient history of aortic stenosis would like patient to see cardiology   -at visit today heart and lung sound fine   -would like patient to be seen within the week by Dr. Allyson Sabal or other provider in office  Patient scheduled for OV 2/5 at 8:25am with PA Lianne Cure states she will notify patient of OV  Toni Amend has no further questions at this time

## 2023-09-07 NOTE — Telephone Encounter (Signed)
Toni Amend, [place] from [facility] calling stating patient is in office with SOB/lightheadedness.

## 2023-09-13 NOTE — Progress Notes (Signed)
 Cardiology Office Note   Date:  09/14/2023  ID:  STANLEY HELMUTH, DOB 07-11-1946, MRN [id number] PCP:  Gib Charleston, MD [facility] Cardiologist: Dorn Lesches, MD  Reason for visit: Persistent shortness of breath  History of Present Illness    [name] is a 78 y.o. female with a hx of severe aortic stenosis status post TAVR in May 2023 with Dr. Verlin and Dr. Lucas using an Celestia SAPIEN 3 THV 23 mm bioprosthetic valve. She felt clinically improved after that. 2D echo 12/29/2022 revealed normal LV systolic function with moderate perivalvular leak - monitored closely.  Preop LHC showed normal coronary arteries.  Today, she comes in with her husband.  She mentions that she recently had an upper respiratory infection with cough and chest congestion (08/18/23).  No fever.  She treated herself with Mucinex and albuterol inhaler.  She came in today concerned about a couple episodes of sudden shortness of breath.  First episode 09/05/23.  After drinking ice cold water quickly she felt shortness of breath lasting 10 to 15 seconds.  She had another episode 1/28 after she was leaving a restaurant and it was cold outside where she felt shortness of breath for approximately 15 seconds followed by fatigue.  She saw her PCP 1/29 who thought maybe cold air was a trigger and recommend she use her albuterol inhaler if she felt short of breath.  When she is leaving her PCP office, she felt sudden onset of shortness of breath, immediately relieved by her albuterol inhaler.    Her last symptoms were 1 week ago she felt more short of breath after going up the steps with a burning sensation in her neck and fatigue.  Since then, she has had no further shortness of breath.  She lives in a 3 story townhome and is able to ambulate the stairs without any increased dyspnea.  She denies chest pain, lower extremity edema, orthopnea, PND, lightheadedness and syncope.  No palpitations.   Objective / Physical Exam    Vital signs:  BP 130/78 (BP Location: Right Arm, Patient Position: Standing, Cuff Size: Normal)   Pulse 79   Ht 5' 2 (1.575 m)   Wt 191 lb (86.6 kg)   SpO2 97%   BMI 34.93 kg/m     GEN: No acute distress NECK: No carotid bruits CARDIAC: RRR, II/VI murmur RESPIRATORY:  Clear to auscultation without rales, wheezing or rhonchi  EXTREMITIES: No edema  Assessment and Plan   Aortic stenosis s/p TAVR Perivalvular leak  Shortness of breath -TAVR in May 2023 with Dr. Verlin and Dr. Lucas using an Celestia SAPIEN 3 THV 23 mm bioprosthetic valve. She felt clinically improved after that.  -2D echo 12/29/2022 revealed normal LV systolic function with moderate perivalvular leak  -Her shortness of breath appears to be triggered by the cold air/cold water following an upper respiratory infection.  Recommend using albuterol inhaler if she feels shortness of breath for the next 4 weeks until she is completely recovered from her URI. -We will move up her 2D echo to follow-up her aortic valve disease/leak.  I am reassured that she has not had evidence of fluid retention, no dyspnea on exertion over the last week and the fact that her albuterol inhaler has helps relieve her symptoms. -We will see her back in 3 months.  Recommended to call our office if she has persistent dyspnea on exertion/shortness of breath.    Disposition - Follow-up in 3 months.   Signed, Delon  MARLA Holts, PA-C  09/14/2023 [facility]

## 2023-09-14 ENCOUNTER — Encounter: Payer: Self-pay | Admitting: Physician Assistant

## 2023-09-14 ENCOUNTER — Ambulatory Visit: Payer: Medicare Other | Attending: Physician Assistant | Admitting: Physician Assistant

## 2023-09-14 VITALS — BP 130/78 | HR 79 | Ht 62.0 in | Wt 191.0 lb

## 2023-09-14 DIAGNOSIS — I350 Nonrheumatic aortic (valve) stenosis: Secondary | ICD-10-CM

## 2023-09-14 DIAGNOSIS — T8203XD Leakage of heart valve prosthesis, subsequent encounter: Secondary | ICD-10-CM

## 2023-09-14 DIAGNOSIS — R0602 Shortness of breath: Secondary | ICD-10-CM | POA: Diagnosis not present

## 2023-09-14 DIAGNOSIS — Z952 Presence of prosthetic heart valve: Secondary | ICD-10-CM

## 2023-09-14 NOTE — Patient Instructions (Signed)
 Medication Instructions:  NO CHANGES *If you need a refill on your cardiac medications before your next appointment, please call your pharmacy*   Lab Work: NO LABS If you have labs (blood work) drawn today and your tests are completely normal, you will receive your results only by: MyChart Message (if you have MyChart) OR A paper copy in the mail If you have any lab test that is abnormal or we need to change your treatment, we will call you to review the results.   Testing/Procedures: NO TESTING   Follow-Up: At [facility], you and your health needs are our priority.  As part of our continuing mission to provide you with exceptional heart care, we have created designated Provider Care Teams.  These Care Teams include your primary Cardiologist (physician) and Advanced Practice Providers (APPs -  Physician Assistants and Nurse Practitioners) who all work together to provide you with the care you need, when you need it.  Your next appointment:   3 month(s)  Provider:   Dorn Lesches, MD   Other Instructions

## 2023-10-04 ENCOUNTER — Other Ambulatory Visit (HOSPITAL_COMMUNITY): Payer: Self-pay | Admitting: *Deleted

## 2023-10-04 ENCOUNTER — Ambulatory Visit (HOSPITAL_COMMUNITY): Payer: Medicare Other | Attending: Cardiovascular Disease

## 2023-10-04 DIAGNOSIS — I350 Nonrheumatic aortic (valve) stenosis: Secondary | ICD-10-CM | POA: Insufficient documentation

## 2023-10-04 DIAGNOSIS — Z952 Presence of prosthetic heart valve: Secondary | ICD-10-CM | POA: Insufficient documentation

## 2023-10-04 LAB — ECHOCARDIOGRAM COMPLETE
AR max vel: 1.67 cm2
AV Area VTI: 1.65 cm2
AV Area mean vel: 1.56 cm2
AV Mean grad: 19.5 mmHg
AV Peak grad: 38.7 mmHg
Ao pk vel: 3.11 m/s
Area-P 1/2: 2.91 cm2
P 1/2 time: 454 ms
S' Lateral: 2.9 cm

## 2023-12-12 ENCOUNTER — Encounter: Payer: Self-pay | Admitting: Cardiovascular Disease

## 2023-12-12 ENCOUNTER — Other Ambulatory Visit (HOSPITAL_COMMUNITY): Payer: Medicare Other

## 2023-12-12 ENCOUNTER — Ambulatory Visit: Payer: Medicare Other | Attending: Cardiovascular Disease | Admitting: Cardiovascular Disease

## 2023-12-12 VITALS — BP 124/70 | HR 72 | Ht 62.0 in | Wt 189.0 lb

## 2023-12-12 DIAGNOSIS — Z952 Presence of prosthetic heart valve: Secondary | ICD-10-CM | POA: Diagnosis not present

## 2023-12-12 NOTE — Progress Notes (Signed)
 12/12/2023 [name]   11-Oct-1945  [id number]  Primary Physician Rae Bugler, MD Primary Cardiologist: Avanell Leigh MD Lathan Poke, [place], [place]  HPI:  [name] is a 78 y.o.    mildly overweight married Caucasian female mother of one 44 year old son, grandmother of 2 grandchildren who I last saw in the office 08/01/2023.  She is accompanied by her husband Josiah Nigh today.  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  She is referred by her PCP, Dr. Rae Bugler, for evaluation of severe aortic stenosis.  She apparently was in a RSV trial and had one of the physicians auscultate a murmur.  This was confirmed by her PCP who ordered a 2D echo at their practice which was notable for severe aortic stenosis.  She has minimal symptoms except for mild dyspnea when she walks up 2 sets of stairs in her 3 level townhome.  She denies chest pain or dizziness.  She has no other cardiac risk factors.  She is never had a heart attack or stroke.  There is no family history for heart disease.  She has noticed increasing dyspnea with less exertion as well as more fatigue.  She denies chest pain.  We discussed performing right left heart cath to define her anatomy and physiology in anticipation of aortic valve replacement.  I performed outpatient right and left heart cath on her 10/12/2021 through through the right radial/brachial approach.  I demonstrated normal coronary arteries and moderate to severe aortic stenosis with a valve area of 1 cm with a valve index of 0.55 cm/m and a peak gradient of 40 mmHg.    I referred her to the multidisciplinary structural clinic who evaluated her for TAVR.  She ultimately had TAVR procedure performed 12/29/2021 by Drs. McAlhany and Bartle using an Edwards SAPIEN 3 THV 23 mm bioprosthetic valve.  She felt clinically improved after that.  Her most recent 2D echo performed 12/29/2022 revealed normal LV systolic function with moderate perivalvular  leak which is being followed carefully by 2D echo.  She does say that she feels clinically improved since the procedure.  She and her husband Josiah Nigh are anticipating move into [facility] independent living in the next 6 months.  Since I saw her in the office 5 months ago she continues to do well.  Her 2D echo performed 10/04/2023 revealed normal LV systolic function with a well-functioning aortic bioprosthesis.  She is active and otherwise asymptomatic.   Current Meds  Medication Sig   acetaminophen  (TYLENOL ) 500 MG tablet Take 500 mg by mouth every 6 (six) hours as needed for moderate pain.   acyclovir (ZOVIRAX) 400 MG tablet Take 400 mg by mouth 2 (two) times daily as needed (Cold sore).   albuterol (VENTOLIN HFA) 108 (90 Base) MCG/ACT inhaler Inhale 1 puff into the lungs every 4 (four) hours as needed.   aspirin  EC 81 MG tablet Take 81 mg by mouth daily.   azithromycin  (ZITHROMAX ) 500 MG tablet Take 1 tablet (500 mg total) by mouth as directed. Take one tablet 1 hour before any dental work including cleanings.   brimonidine  (ALPHAGAN  P) 0.1 % SOLN Place 1 drop into both eyes every 12 (twelve) hours.   Calcium Carb-Cholecalciferol (CALCIUM 600 + D PO) Take 1 tablet by mouth 2 (two) times daily.   Calcium Citrate-Vitamin D (CAL-CITRATE PLUS VITAMIN D) 250-2.5 MG-MCG TABS Take 2 tablets by mouth daily.   cholecalciferol (VITAMIN D3)  25 MCG (1000 UNIT) tablet Take 1,000 Units by mouth daily. Midday   docusate sodium  (COLACE) 100 MG capsule Take 100 mg by mouth 2 (two) times daily.   dorzolamide -timolol  (COSOPT ) 22.3-6.8 MG/ML ophthalmic solution Place 1 drop into both eyes every 12 (twelve) hours.   famotidine  (PEPCID ) 20 MG tablet Take 20 mg by mouth daily as needed for indigestion (if take Aleve).   Flaxseed, Linseed, (FLAX SEED OIL) 1000 MG CAPS Take 1,000 mg by mouth daily.   Glucosamine-Chondroit-Vit C-Mn (GLUCOSAMINE 1500 COMPLEX PO) Take 1 tablet by mouth 2 (two) times daily. 1500 mg /1200 mg    guaiFENesin 200 MG tablet Take 200 mg by mouth every 8 (eight) hours as needed for cough or to loosen phlegm. [place] Brand Mucinex   latanoprost  (XALATAN ) 0.005 % ophthalmic solution Place 1 drop into both eyes at bedtime.   Liniments (SALONPAS EX) Place 1 patch onto the skin at bedtime as needed (back pain). Pain patch   Magnesium  250 MG TABS Take 250 mg by mouth daily.   meclizine  (ANTIVERT ) 25 MG tablet Take 25 mg by mouth 3 (three) times daily as needed (for motion sickness).   melatonin 5 MG TABS Take 5 mg by mouth at bedtime as needed (Sleep).   Multiple Vitamin (MULTIVITAMIN WITH MINERALS) TABS tablet Take 1 tablet by mouth daily. [place]   naproxen sodium (ALEVE) 220 MG tablet Take 220 mg by mouth daily as needed (pain).   polyethylene glycol (MIRALAX  / GLYCOLAX ) packet Take 17 g by mouth daily.   Propylene Glycol (SYSTANE COMPLETE) 0.6 % SOLN Place 1 drop into both eyes as needed (dry eyes).   Psyllium (METAMUCIL PO) Take 1 capsule by mouth 2 (two) times daily. [place]   raloxifene  (EVISTA ) 60 MG tablet Take 60 mg by mouth daily.   SODIUM FLUORIDE 5000 PPM 1.1 % PSTE Place 1 application onto teeth at bedtime.   trolamine salicylate (ASPERCREME) 10 % cream Apply 1 application. topically daily as needed (joint or muscle pain). With lidocaine    valACYclovir (VALTREX) 1000 MG tablet Take 2 tablets by mouth as needed.     Allergies  Allergen Reactions   Codeine Nausea Only   Oxaprozin     Other reaction(s): nausea   Keflex [Cephalexin] Rash    Social History   Socioeconomic History   Marital status: Married    Spouse name: Not on file   Number of children: 1   Years of education: Not on file   Highest education level: Not on file  Occupational History   Occupation: Retired nurse-pediatrics  Tobacco Use   Smoking status: Never   Smokeless tobacco: Never  Vaping Use   Vaping status: Never Used  Substance and Sexual Activity   Alcohol use: Not Currently   Drug use:  No   Sexual activity: Not on file  Other Topics Concern   Not on file  Social History Narrative   Not on file   Social Drivers of Health   Financial Resource Strain: Not on file  Food Insecurity: Not on file  Transportation Needs: Not on file  Physical Activity: Not on file  Stress: Not on file  Social Connections: Not on file  Intimate Partner Violence: Not on file     Review of Systems: General: negative for chills, fever, night sweats or weight changes.  Cardiovascular: negative for chest pain, dyspnea on exertion, edema, orthopnea, palpitations, paroxysmal nocturnal dyspnea or shortness of breath Dermatological: negative for rash Respiratory: negative for cough or wheezing  Urologic: negative for hematuria Abdominal: negative for nausea, vomiting, diarrhea, bright red blood per rectum, melena, or hematemesis Neurologic: negative for visual changes, syncope, or dizziness All other systems reviewed and are otherwise negative except as noted above.    Blood pressure 124/70, pulse 72, height 5\' 2"  (1.575 m), weight 189 lb (85.7 kg), SpO2 96%.  General appearance: alert Neck: no adenopathy, no carotid bruit, no JVD, supple, symmetrical, trachea midline, and thyroid  not enlarged, symmetric, no tenderness/mass/nodules Lungs: clear to auscultation bilaterally Heart: regular rate and rhythm, S1, S2 normal, no murmur, click, rub or gallop Extremities: extremities normal, atraumatic, no cyanosis or edema Pulses: 2+ and symmetric Skin: Skin color, texture, turgor normal. No rashes or lesions Neurologic: Grossly normal  EKG not performed today      ASSESSMENT AND PLAN:   S/P TAVR (transcatheter aortic valve replacement) History of severe aortic stenosis status post right left heart cath by myself 10/08/2021 revealing normal coronary arteries with severe AAS.  She underwent TAVR by Drs. Bartle and Mcalhany 12/29/2021 with an Edwards SAPIEN 3 THV 23 mm bioprosthetic valve.  She was  clinically improved after that.  2D echo performed 10/04/2023 revealed normal LV systolic function with an estimated RV systolic pressure of 41 mmHg and a well-functioning aortic bioprosthesis unchanged from the prior study.     Avanell Leigh MD FACP,FACC,FAHA, [facility] 12/12/2023 9:42 AM

## 2023-12-12 NOTE — Assessment & Plan Note (Signed)
 History of severe aortic stenosis status post right left heart cath by myself 10/08/2021 revealing normal coronary arteries with severe AAS.  She underwent TAVR by Drs. Bartle and Mcalhany 12/29/2021 with an Edwards SAPIEN 3 THV 23 mm bioprosthetic valve.  She was clinically improved after that.  2D echo performed 10/04/2023 revealed normal LV systolic function with an estimated RV systolic pressure of 41 mmHg and a well-functioning aortic bioprosthesis unchanged from the prior study.

## 2023-12-12 NOTE — Patient Instructions (Signed)
 Medication Instructions:  Your physician recommends that you continue on your current medications as directed. Please refer to the Current Medication list given to you today.  *If you need a refill on your cardiac medications before your next appointment, please call your pharmacy*   Testing/Procedures: Your physician has requested that you have an echocardiogram. Echocardiography is a painless test that uses sound waves to create images of your heart. It provides your doctor with information about the size and shape of your heart and how well your heart's chambers and valves are working. This procedure takes approximately one hour. There are no restrictions for this procedure. Please do NOT wear cologne, perfume, aftershave, or lotions (deodorant is allowed). Please arrive 15 minutes prior to your appointment time.  Please note: We ask at that you not bring children with you during ultrasound (echo/ vascular) testing. Due to room size and safety concerns, children are not allowed in the ultrasound rooms during exams. Our front office staff cannot provide observation of children in our lobby area while testing is being conducted. An adult accompanying a patient to their appointment will only be allowed in the ultrasound room at the discretion of the ultrasound technician under special circumstances. We apologize for any inconvenience. **To do in February 2026**   Follow-Up: At [facility], you and your health needs are our priority.  As part of our continuing mission to provide you with exceptional heart care, our providers are all part of one team.  This team includes your primary Cardiologist (physician) and Advanced Practice Providers or APPs (Physician Assistants and Nurse Practitioners) who all work together to provide you with the care you need, when you need it.  Your next appointment:   12 month(s)  Provider:   Lauro Portal, MD    We recommend signing up for the patient  portal called "MyChart".  Sign up information is provided on this After Visit Summary.  MyChart is used to connect with patients for Virtual Visits (Telemedicine).  Patients are able to view lab/test results, encounter notes, upcoming appointments, etc.  Non-urgent messages can be sent to your provider as well.   To learn more about what you can do with MyChart, go to [email or web address].

## 2024-02-17 IMAGING — CT CT ANGIO CHEST
2 of 7 series · 16 of 36 positions shown · IV contrast (APPLIED)
Comparison: None.

CLINICAL DATA: Preop evaluation for TAVR

EXAM:
CT ANGIOGRAPHY CHEST, ABDOMEN AND PELVIS
TECHNIQUE: Non-contrast CT of the chest was initially obtained.

[Series 7: ax thins · axial · 0.59mm/px · z∈[-479,+119]mm · 15 of 674 slices shown]
[im 38/674  lung]
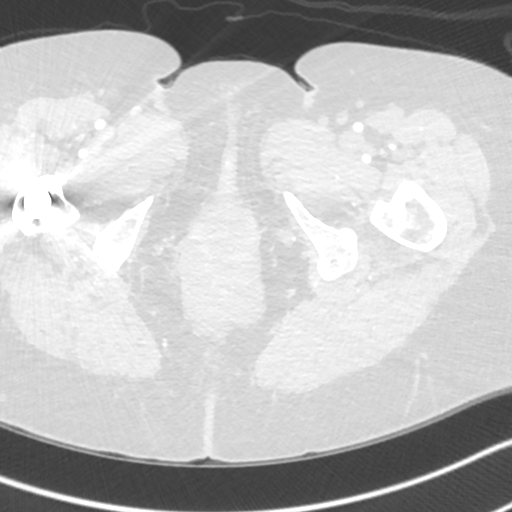
[im 75/674  mediastinal]
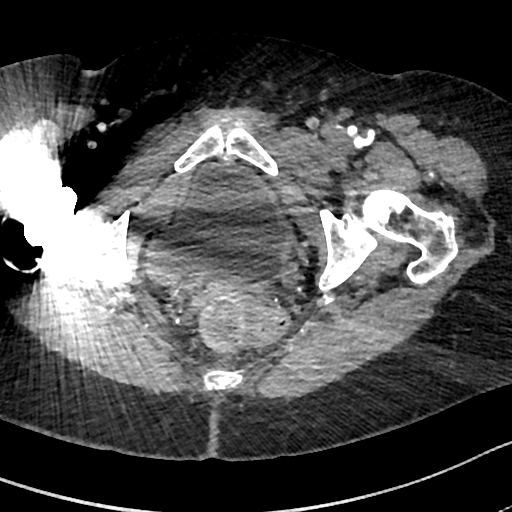
[im 113/674  lung]
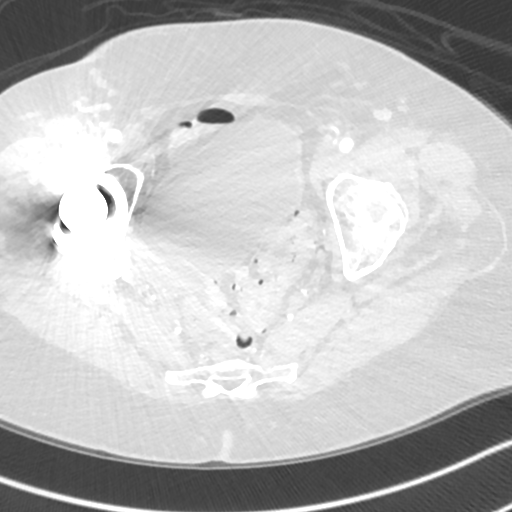
[im 150/674  mediastinal]
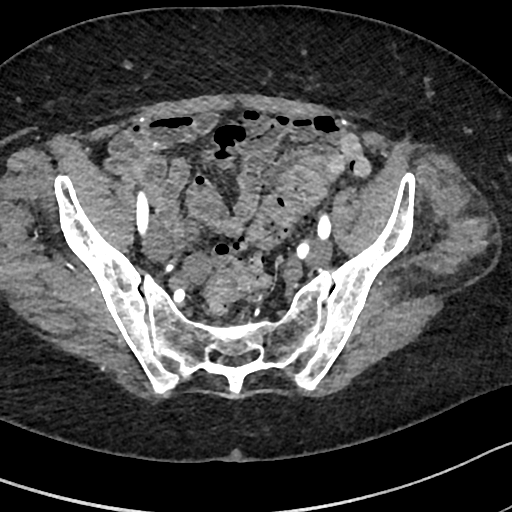
[im 225/674  lung]
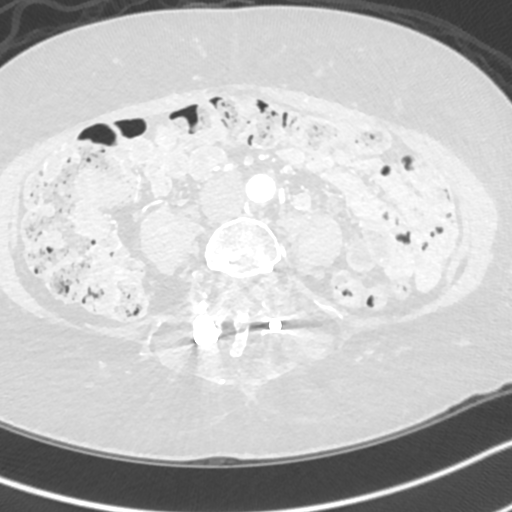
[im 262/674  mediastinal]
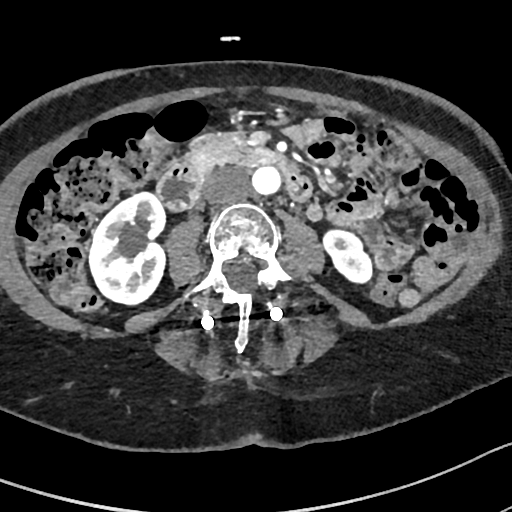
[im 300/674  lung]
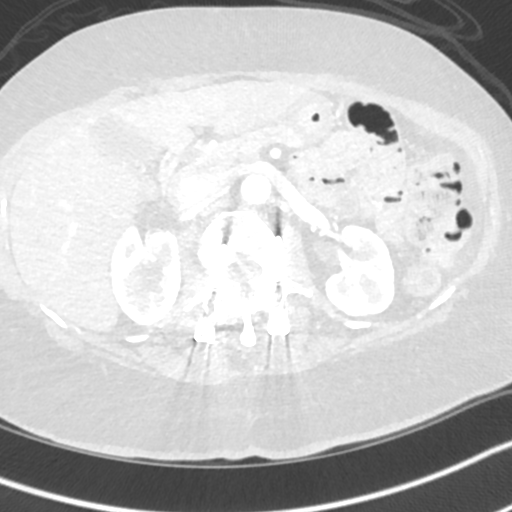
[im 337/674  mediastinal]
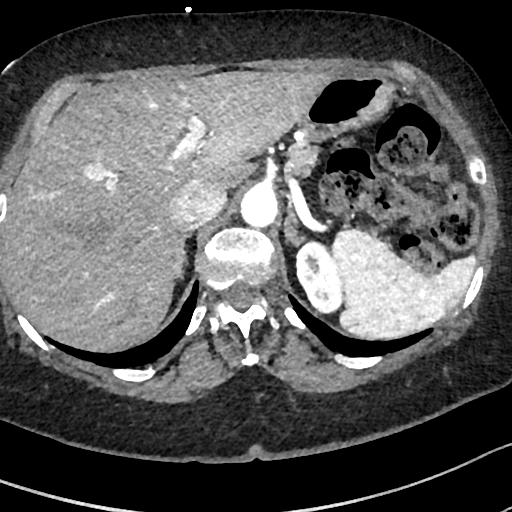
[im 374/674  lung]
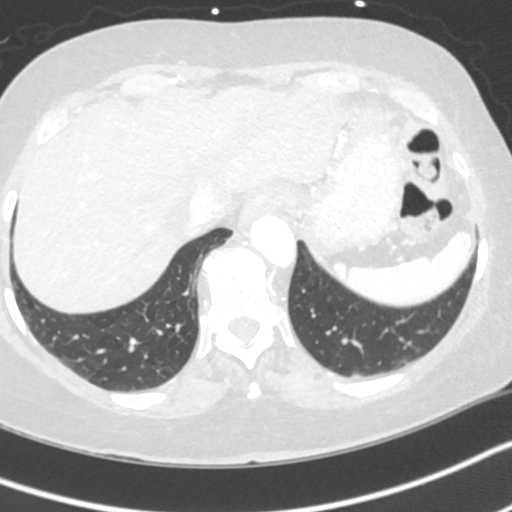
[im 412/674  mediastinal]
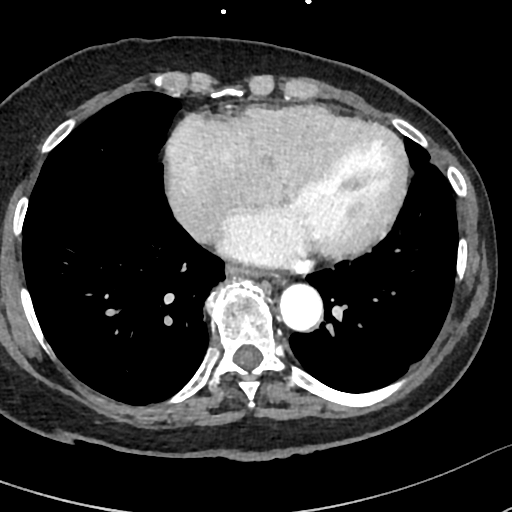
[im 449/674  lung]
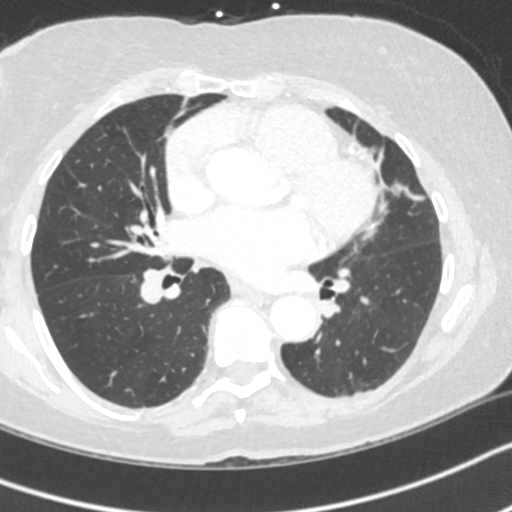
[im 524/674  mediastinal]
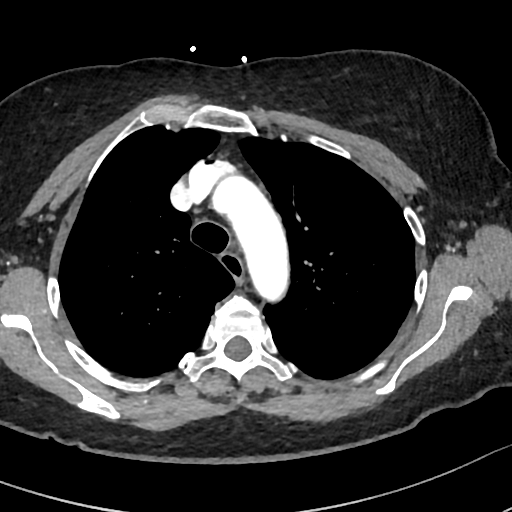
[im 561/674  lung]
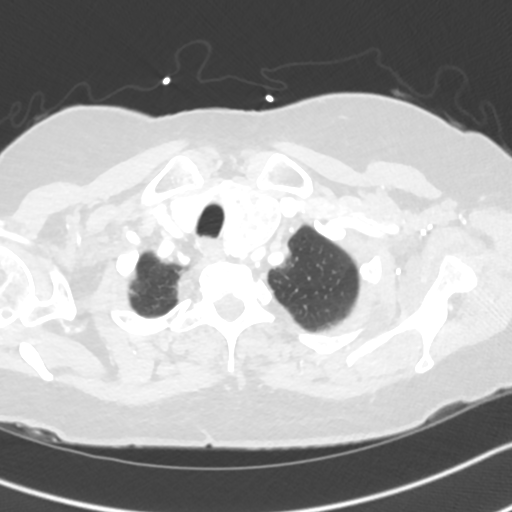
[im 599/674  mediastinal]
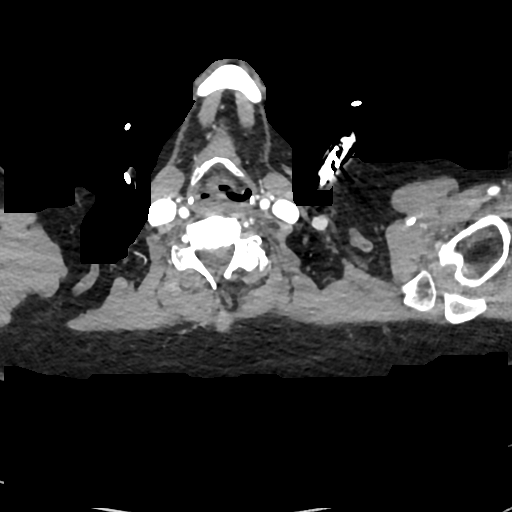
[im 636/674  lung]
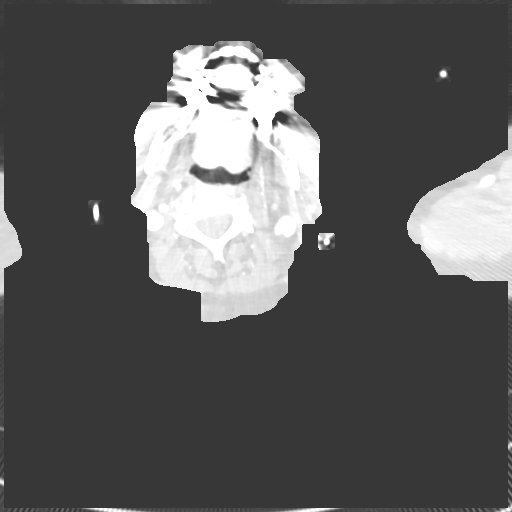

[Series 10: cor · coronal · 0.73mm/px · 1 of 136 slices shown]
[im 68/136  mediastinal]
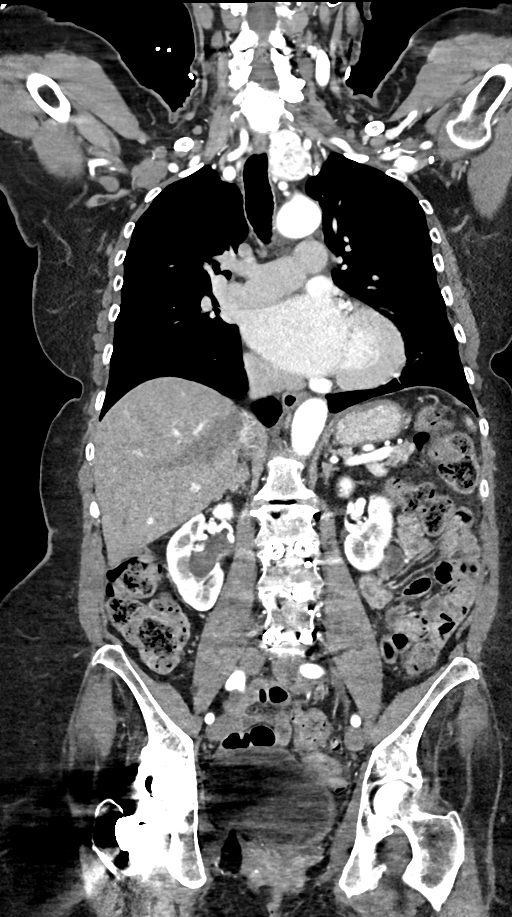

[16 of 36 positions shown; findings below may reference images not displayed]

Multidetector CT imaging through the chest, abdomen and pelvis was
performed using the standard protocol during bolus administration of
intravenous contrast. Multiplanar reconstructed images and MIPs were
obtained and reviewed to evaluate the vascular anatomy.

RADIATION DOSE REDUCTION: This exam was performed according to the
departmental dose-optimization program which includes automated
exposure control, adjustment of the mA and/or kV according to
patient size and/or use of iterative reconstruction technique.

CONTRAST:  80mL OMNIPAQUE IOHEXOL 350 MG/ML SOLN
FINDINGS: CTA CHEST FINDINGS

Cardiovascular: Mild cardiomegaly. No significant pericardial
effusion/thickening. Aortic valve thickening and calcifications.
Coronary artery calcifications of the LAD. Normal variant 4 vessel
aortic arch with the left vertebral artery arising directly from the
aorta. No significant stenosis of the arch vessels. Mild
atherosclerotic disease of the thoracic aorta consisting of soft
plaque. No central pulmonary emboli.

Mediastinum/Nodes: Enlarged and heterogeneous left thyroid lobe.
Patulous esophagus. No pathologically enlarged axillary, mediastinal
or hilar lymph nodes.

Lungs/Pleura: Central airways are patent. No consolidation, pleural
effusion or pneumothorax.

Musculoskeletal:  No aggressive appearing focal osseous lesions.

CTA ABDOMEN AND PELVIS FINDINGS

Hepatobiliary: Normal liver with no liver mass. Normal gallbladder
with no radiopaque cholelithiasis. No biliary ductal dilatation.

Pancreas: Normal, with no mass or duct dilation.

Spleen: Normal size. No mass.

Adrenals/Urinary Tract: Normal adrenals. Mild right hydronephrosis
with caliber change at the UPJ, unchanged when compared with prior
exam and likely due to chronic UPJ obstruction. No nephrolithiasis.
Normal bladder.

Stomach/Bowel: Normal non-distended stomach. Normal caliber small
bowel with no small bowel wall thickening. Diverticulosis. No large
bowel wall thickening or pericolonic fat stranding.

Vascular/Lymphatic: Minimal calcified plaque of the right internal
iliac artery otherwise, otherwise no evidence of aortoiliac
atherosclerotic disease. Major branch vessels are normal in caliber
with no significant stenosis. No pathologically enlarged lymph nodes
in the abdomen or pelvis.

Reproductive: No adnexal masses.

Other: No pneumoperitoneum, ascites or focal fluid collection.

Musculoskeletal: No aggressive appearing focal osseous lesions.
Prior total right hip replacement and posterior fusion of the
lumbosacral spine.

VASCULAR MEASUREMENTS PERTINENT TO TAVR:

AORTA:

Minimal Aortic Giameter-4T.5 mm

Severity of Aortic Calcification-none

RIGHT PELVIS:

Right Common Iliac Artery -

Minimal Giameter-II.N mm

Tortuosity-none

Calcification-none

Right External Iliac Artery -

Minimal 8iameter-P.S mm

Tortuosity-moderate

Calcification-none

Right Common Femoral Artery -

Minimal 4iameter-J.J mm

Tortuosity-none

Calcification-minimal

LEFT PELVIS:

Left Common Iliac Artery -

Minimal Diameter-E.V mm

Tortuosity-moderate

Calcification-none

Left External Iliac Artery -

Minimal Uiameter-P.6 mm

Tortuosity-moderate

Calcification-none

Left Common Femoral Artery -

Minimal Tiameter-W.Y mm

Tortuosity-none

Calcification-none

Review of the MIP images confirms the above findings.
IMPRESSION: Vascular:

1. Vascular findings and measurements pertinent to potential TAVR
procedure, as detailed above.
2. Severe thickening calcification of the aortic valve, compatible
with reported clinical history of severe aortic stenosis.
3. Mild aortoiliac atherosclerotic disease. Coronary artery
calcifications of the LAD.

Nonvascular:

1. Enlarged and heterogeneous left thyroid lobe. Recommend further
evaluation with thyroid ultrasound.
2. Diverticulosis with no evidence of diverticulitis.

## 2024-03-31 IMAGING — DX DG CHEST 2V
2 series · 2 of 2 positions shown · non-contrast
Comparison: CT chest 11/12/2021

CLINICAL DATA: Preoperative assessment for TAVR

EXAM:
CHEST - 2 VIEW

[w chest pa]
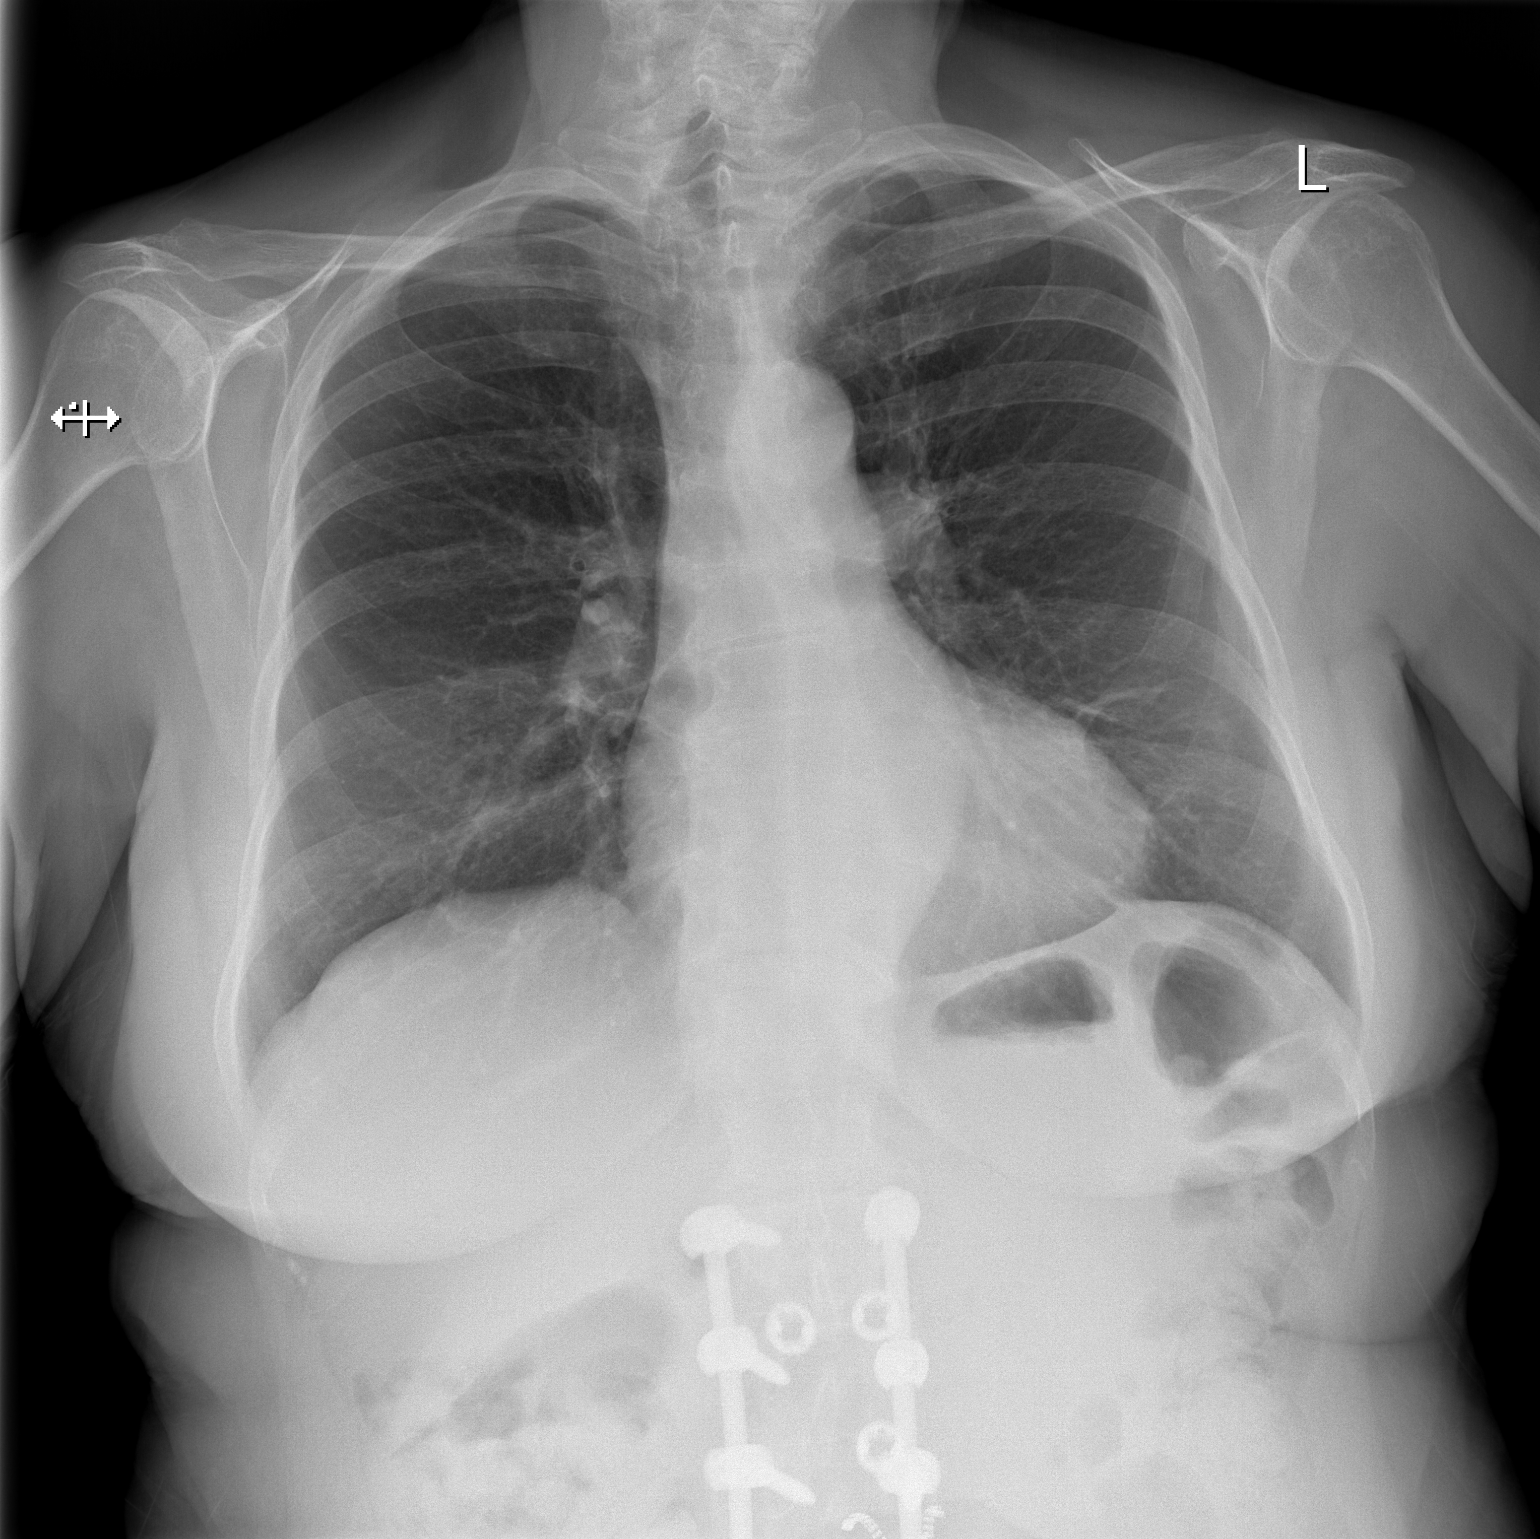

[w chest lat]
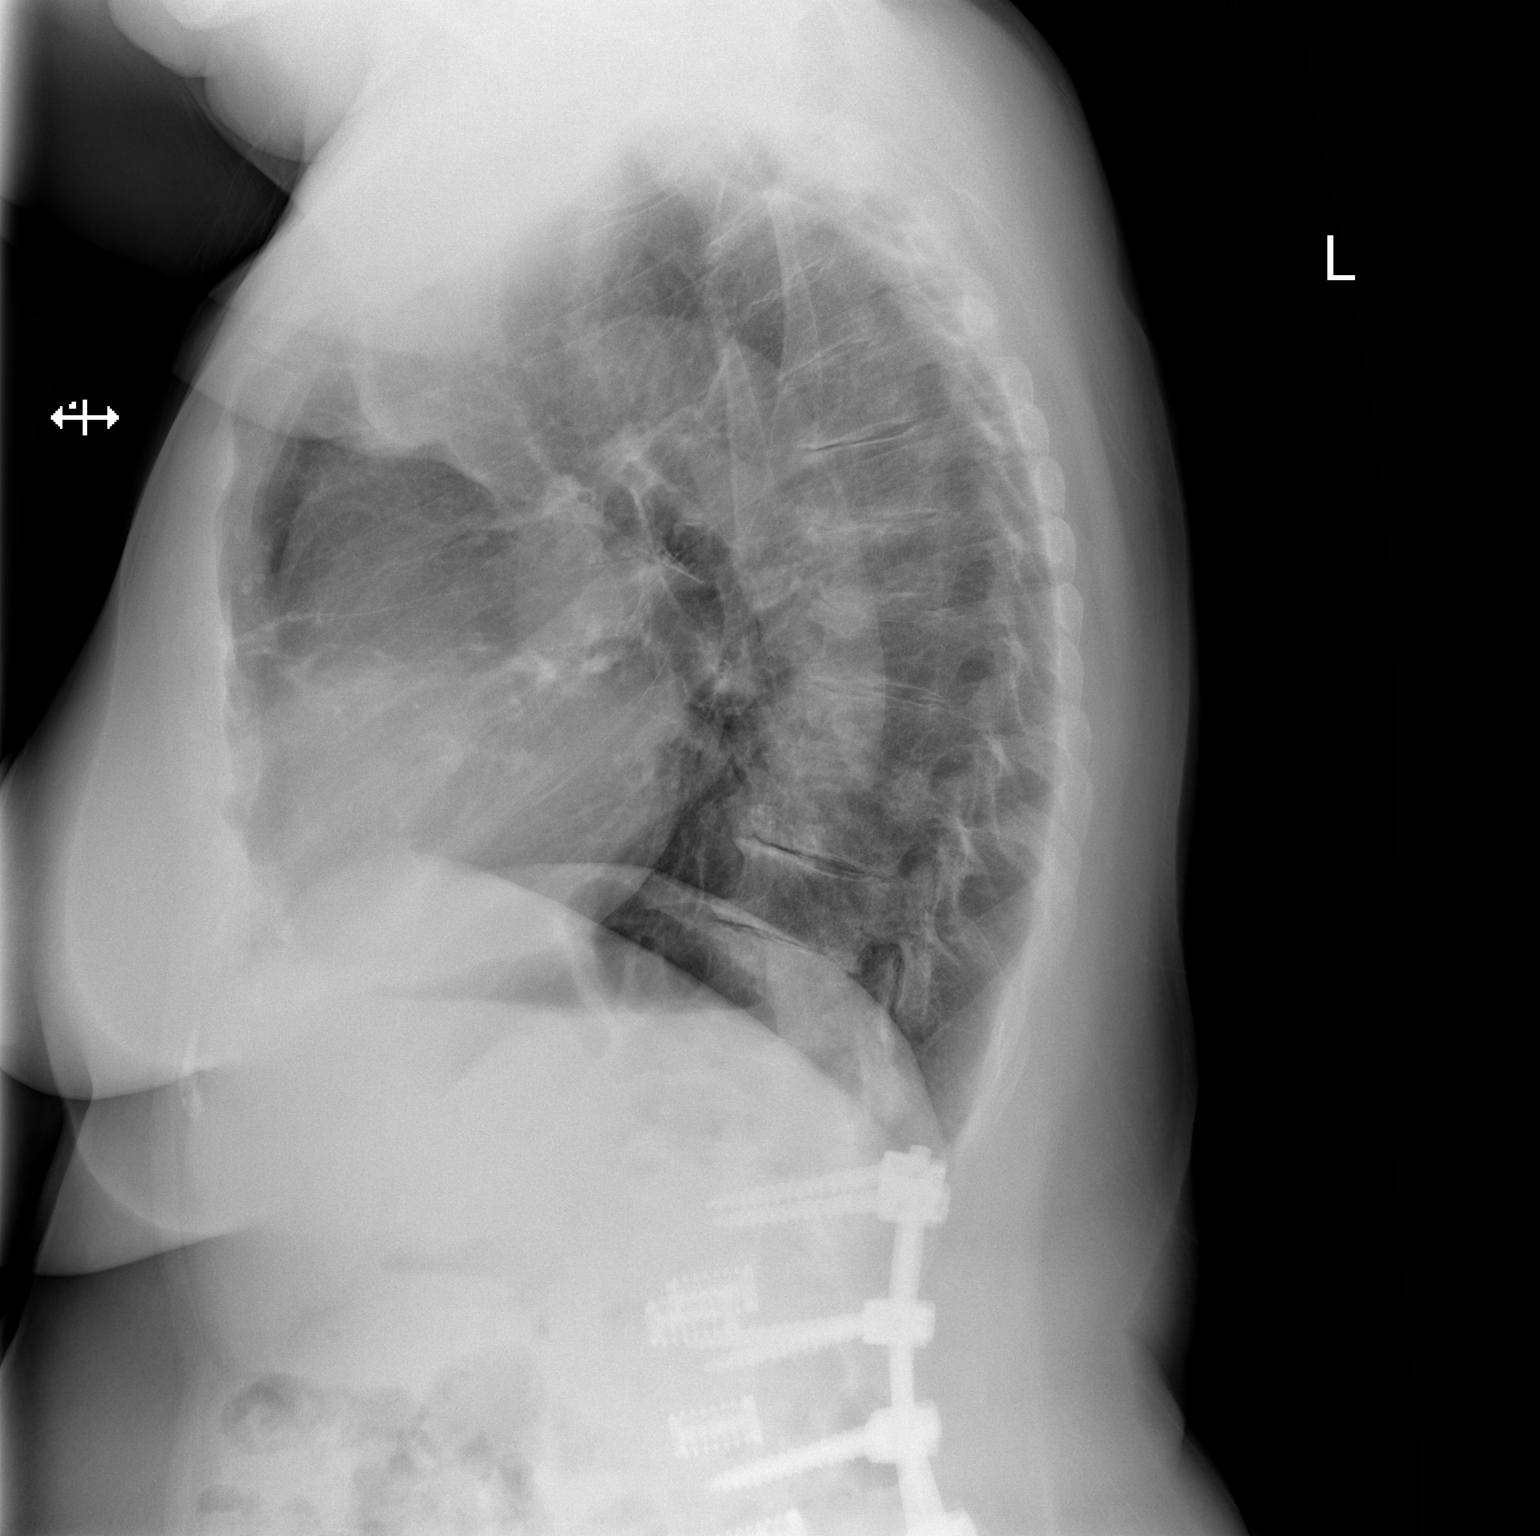

[2 of 2 positions shown; findings below may reference images not displayed]

FINDINGS: No focal consolidation. No pleural effusion or pneumothorax. Heart
and mediastinal contours are unremarkable.

No acute osseous abnormality. Partially visualized posterior lumbar
interbody fusion.
IMPRESSION: No active cardiopulmonary disease.

## 2024-09-11 ENCOUNTER — Ambulatory Visit (HOSPITAL_COMMUNITY)

## 2024-09-11 DIAGNOSIS — Z952 Presence of prosthetic heart valve: Secondary | ICD-10-CM

## 2024-09-11 LAB — ECHOCARDIOGRAM COMPLETE
AR max vel: 1.94 cm2
AV Area VTI: 1.81 cm2
AV Area mean vel: 1.85 cm2
AV Mean grad: 19.7 mmHg
AV Peak grad: 34.7 mmHg
Ao pk vel: 2.95 m/s
Area-P 1/2: 4.06 cm2
P 1/2 time: 358 ms
S' Lateral: 2.8 cm

## 2024-09-12 ENCOUNTER — Ambulatory Visit: Payer: Self-pay | Admitting: Cardiovascular Disease
